# Patient Record
Sex: Female | Born: 1955 | Race: White | Hispanic: No | Smoking: Former smoker
Health system: Southern US, Community
[De-identification: ages and names within clinical notes are randomized; demographics above are authoritative.]

## PROBLEM LIST (undated history)

## (undated) DIAGNOSIS — M1990 Unspecified osteoarthritis, unspecified site: Secondary | ICD-10-CM

## (undated) DIAGNOSIS — B1920 Unspecified viral hepatitis C without hepatic coma: Secondary | ICD-10-CM

## (undated) DIAGNOSIS — M8580 Other specified disorders of bone density and structure, unspecified site: Secondary | ICD-10-CM

## (undated) DIAGNOSIS — J449 Chronic obstructive pulmonary disease, unspecified: Secondary | ICD-10-CM

## (undated) HISTORY — DX: Other specified disorders of bone density and structure, unspecified site: M85.80

## (undated) HISTORY — PX: ABDOMINAL HYSTERECTOMY: SHX81

## (undated) HISTORY — PX: APPENDECTOMY: SHX54

## (undated) HISTORY — PX: HERNIA REPAIR: SHX51

---

## 2008-04-05 ENCOUNTER — Ambulatory Visit: Payer: Self-pay | Admitting: Orthopedic Surgery

## 2009-02-19 ENCOUNTER — Ambulatory Visit: Payer: Self-pay

## 2009-09-10 ENCOUNTER — Ambulatory Visit: Payer: Self-pay | Admitting: Family Medicine

## 2009-09-26 ENCOUNTER — Ambulatory Visit: Payer: Self-pay | Admitting: Podiatry

## 2013-11-28 ENCOUNTER — Ambulatory Visit: Payer: Self-pay | Admitting: Gastroenterology

## 2017-08-25 ENCOUNTER — Other Ambulatory Visit: Payer: Self-pay | Admitting: Student

## 2017-08-25 DIAGNOSIS — B182 Chronic viral hepatitis C: Secondary | ICD-10-CM

## 2017-08-31 ENCOUNTER — Ambulatory Visit
Admission: RE | Admit: 2017-08-31 | Discharge: 2017-08-31 | Disposition: A | Discharge status: Home / Self Care | Payer: BLUE CROSS/BLUE SHIELD | Source: Ambulatory Visit | Attending: Student | Admitting: Student

## 2017-08-31 DIAGNOSIS — B182 Chronic viral hepatitis C: Secondary | ICD-10-CM | POA: Insufficient documentation

## 2017-11-12 IMAGING — US US ABDOMEN COMPLETE W/ ELASTOGRAPHY
1 series · 13 of 25 positions shown · non-contrast
Comparison: None.

CLINICAL DATA: Hepatitis-C, chronic.



[Series 1: us abdomen complete w/ elastography · 0.25mm/px · 13 of 140 slices shown]
[im 1/140]
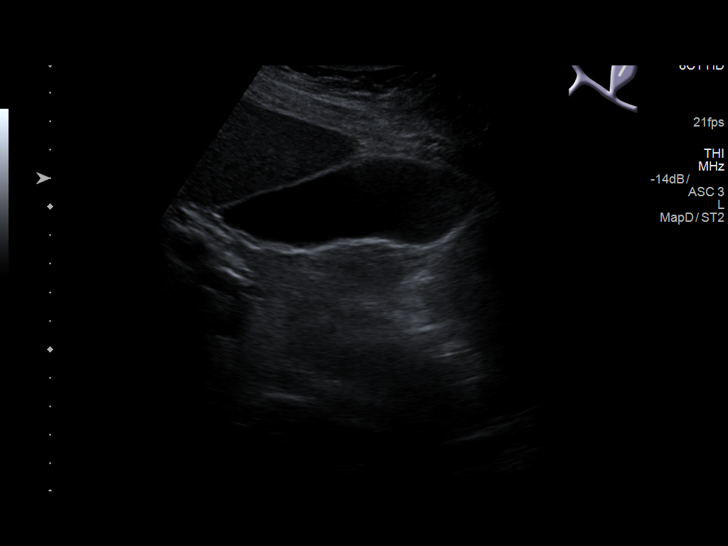
[im 12/140]
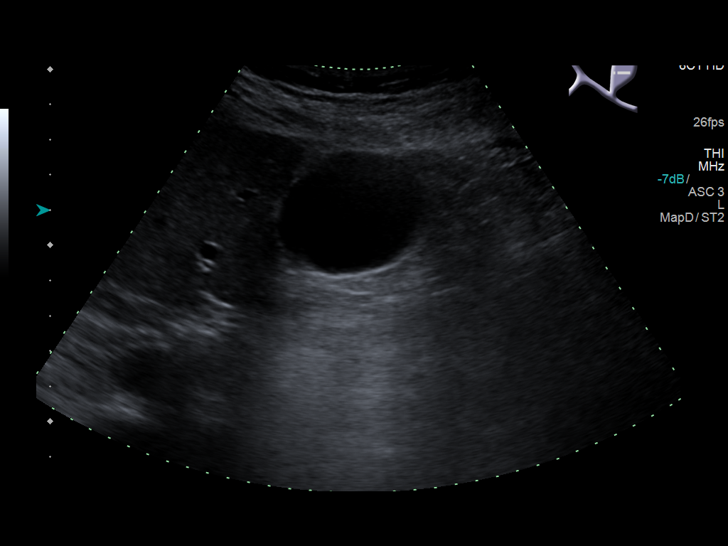
[im 24/140]
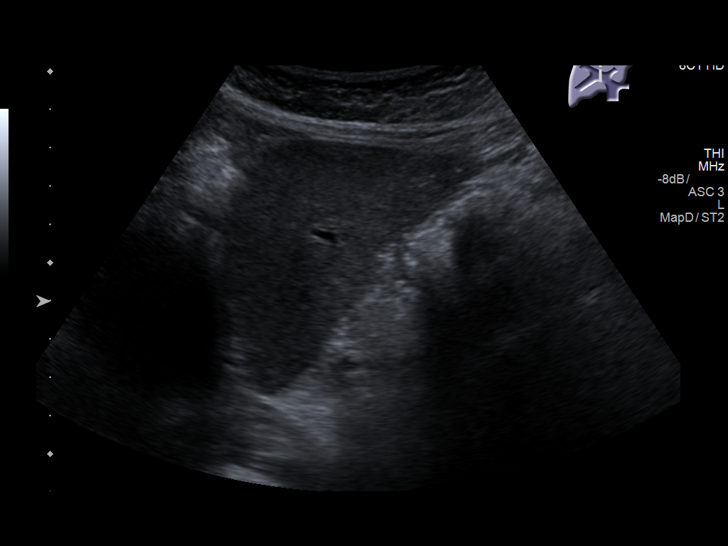
[im 35/140]
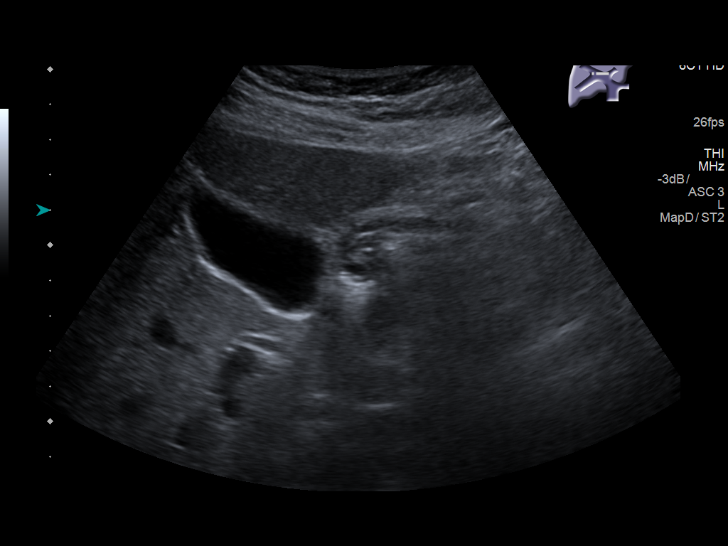
[im 47/140]
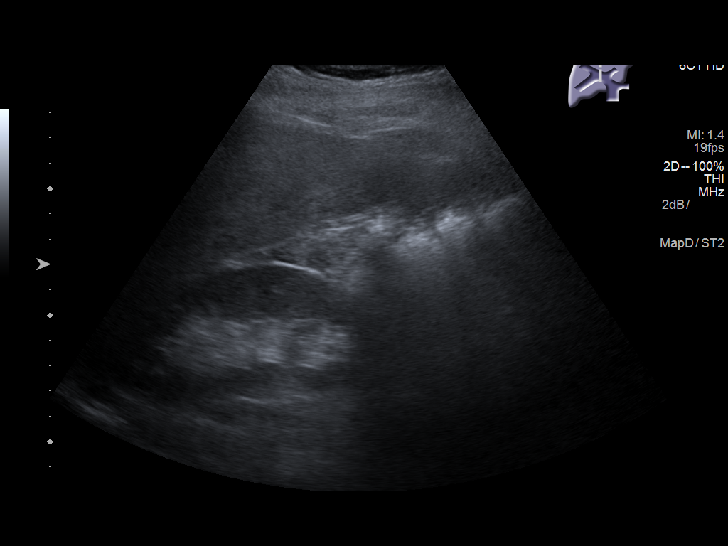
[im 58/140]
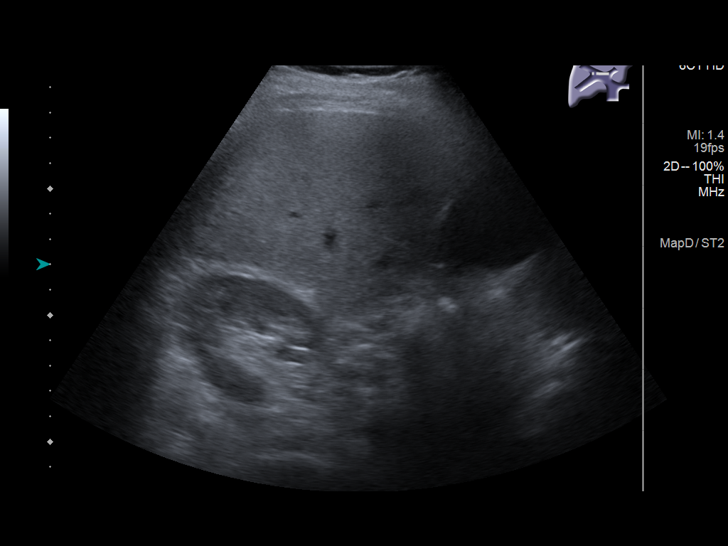
[im 70/140]
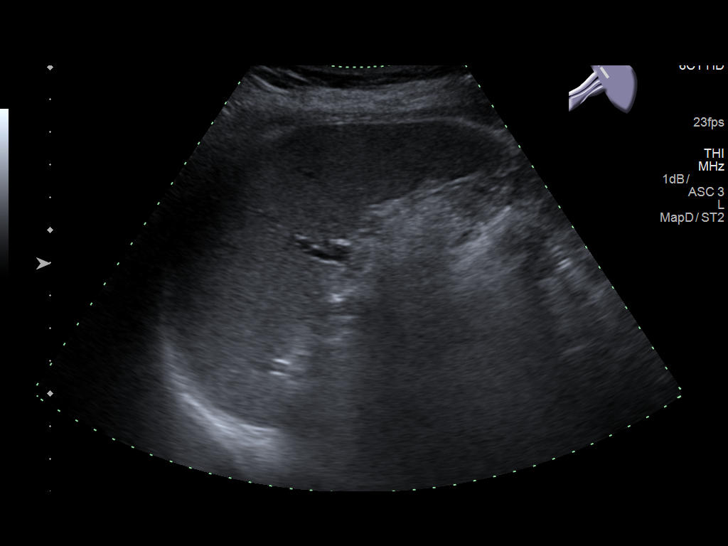
[im 82/140]
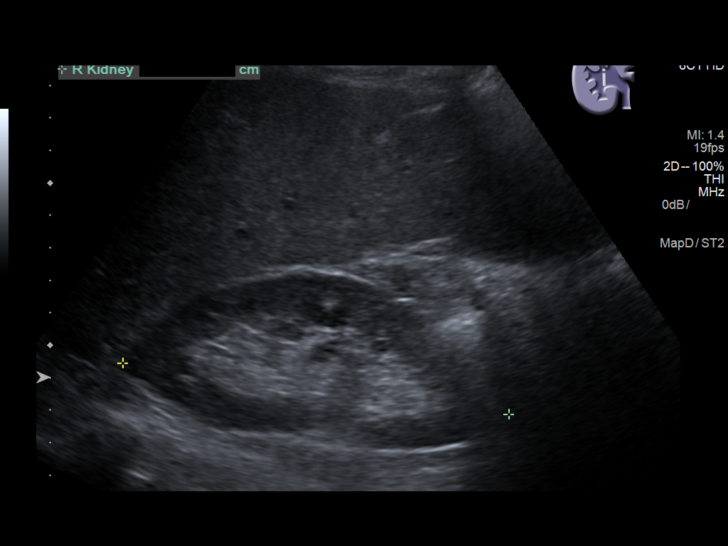
[im 93/140]
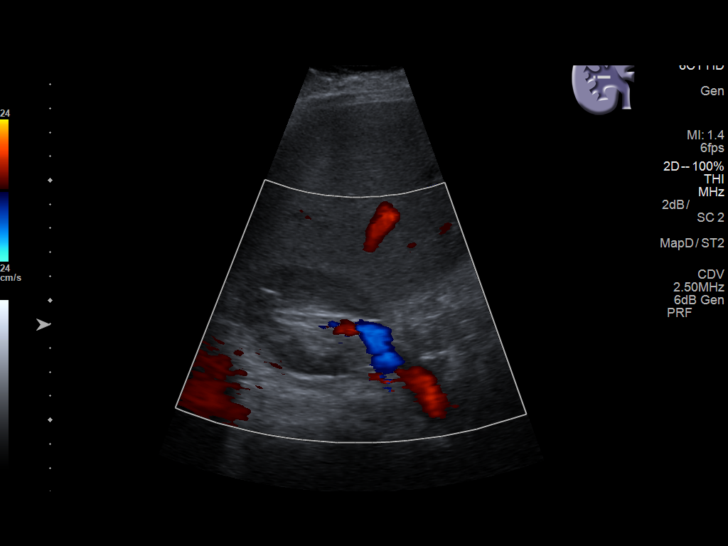
[im 105/140]
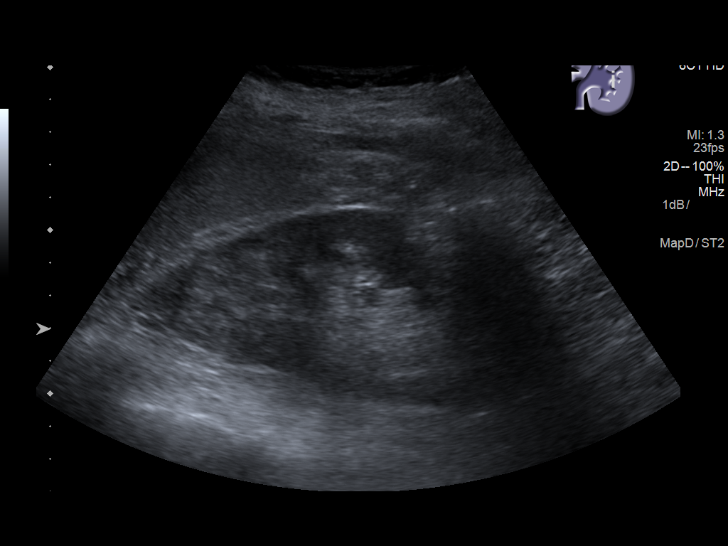
[im 116/140]
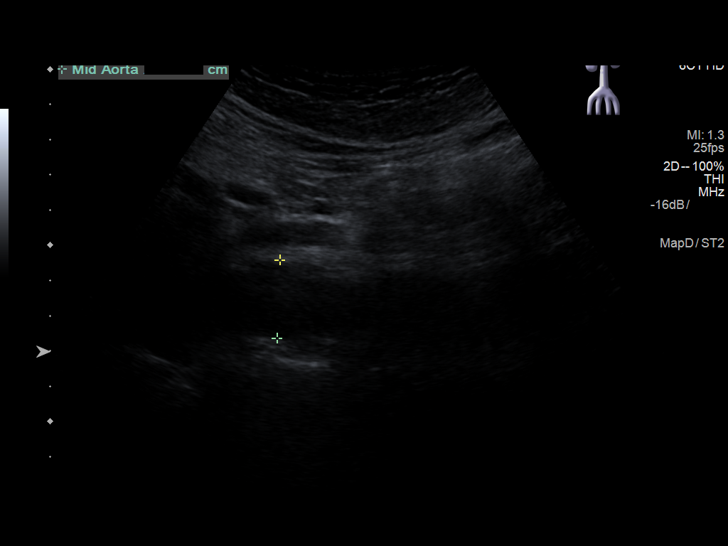
[im 128/140]
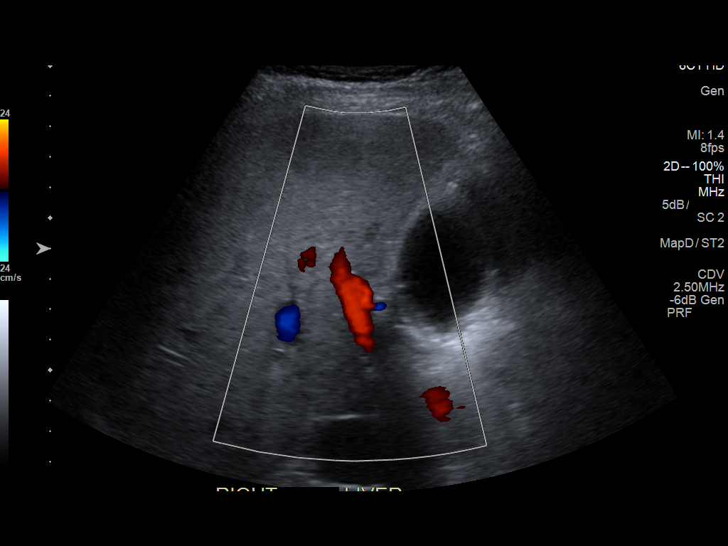
[im 140/140]
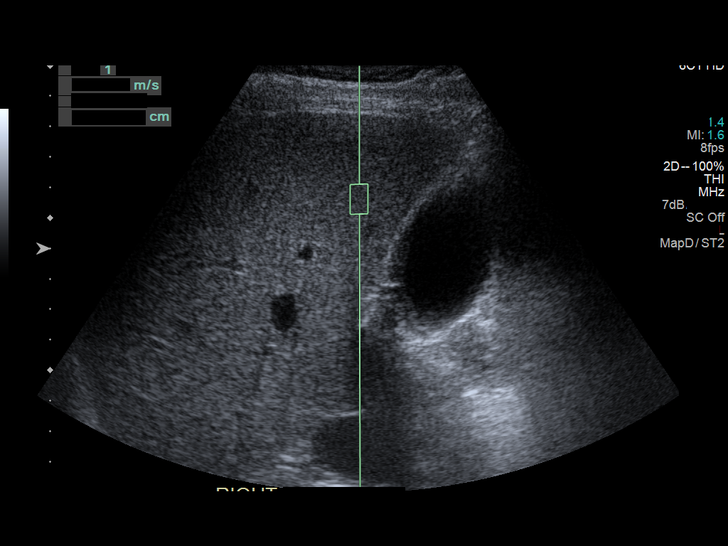

[13 of 25 positions shown; findings below may reference images not displayed]

FINDINGS: ULTRASOUND ABDOMEN

Gallbladder: No gallstones or wall thickening visualized. No
sonographic Murphy sign noted by sonographer.

Common bile duct: Diameter: 4 mm

Liver: Liver parenchyma is diffusely mildly echogenic. No definite
liver surface irregularity fluid. Liver parenchymal echotexture is
within normal limits. No liver mass detected. Portal vein is patent
on color Doppler imaging with normal direction of blood flow towards
the liver.

IVC: No abnormality visualized.

Pancreas: Visualized portion unremarkable.

Spleen: Size and appearance within normal limits.

Right Kidney: Length: 12.0 cm. Echogenicity within normal limits. No
mass or hydronephrosis visualized.

Left Kidney: Length: 12.6 cm. Echogenicity within normal limits. No
mass or hydronephrosis visualized.

Abdominal aorta: No aneurysm visualized.

Other findings: None.

ULTRASOUND HEPATIC ELASTOGRAPHY

Device: Siemens Helix VTQ

Patient position: Supine

Transducer 6C1

Number of measurements: 10

Hepatic segment:  8

Median velocity:   1.33  m/sec

IQR:

IQR/Median velocity ratio:

Corresponding Metavir fibrosis score:  F2 + some F3

Risk of fibrosis: Moderate

Limitations of exam: None

Pertinent findings noted on other imaging exams:  None

Please note that abnormal shear wave velocities may also be
identified in clinical settings other than with hepatic fibrosis,
such as: acute hepatitis, elevated right heart and central venous
pressures including use of beta blockers, Amazigh disease
(Jesus Santos), infiltrative processes such as
mastocytosis/amyloidosis/infiltrative tumor, extrahepatic
cholestasis, in the post-prandial state, and liver transplantation.
Correlation with patient history, laboratory data, and clinical
condition recommended.
IMPRESSION: ULTRASOUND ABDOMEN:

1. Mildly echogenic liver parenchyma, a nonspecific finding that
could be due to steatosis and/or fibrosis. No liver surface
irregularity to suggest cirrhosis. No liver mass.
2. Otherwise normal abdominal sonogram.

ULTRASOUND HEPATIC ELASTOGRAPHY:

Median hepatic shear wave velocity is calculated at 1.33 m/sec.

Corresponding Metavir fibrosis score is  F2 + some F3.

Risk of fibrosis is Moderate.

Follow-up: Additional testing appropriate.

## 2018-07-02 ENCOUNTER — Ambulatory Visit
Admission: RE | Admit: 2018-07-02 | Discharge: 2018-07-02 | Disposition: A | Discharge status: Home / Self Care | Payer: BLUE CROSS/BLUE SHIELD | Source: Ambulatory Visit | Attending: Adult Health | Admitting: Adult Health

## 2018-07-02 ENCOUNTER — Telehealth: Payer: Self-pay | Admitting: Adult Health

## 2018-07-02 ENCOUNTER — Ambulatory Visit: Payer: Self-pay | Admitting: Adult Health

## 2018-07-02 ENCOUNTER — Encounter: Payer: Self-pay | Admitting: Adult Health

## 2018-07-02 DIAGNOSIS — J40 Bronchitis, not specified as acute or chronic: Secondary | ICD-10-CM | POA: Insufficient documentation

## 2018-07-02 DIAGNOSIS — B182 Chronic viral hepatitis C: Secondary | ICD-10-CM

## 2018-07-02 DIAGNOSIS — R059 Cough, unspecified: Secondary | ICD-10-CM

## 2018-07-02 DIAGNOSIS — R350 Frequency of micturition: Secondary | ICD-10-CM

## 2018-07-02 DIAGNOSIS — R05 Cough: Secondary | ICD-10-CM

## 2018-07-02 DIAGNOSIS — R9389 Abnormal findings on diagnostic imaging of other specified body structures: Secondary | ICD-10-CM

## 2018-07-02 DIAGNOSIS — R509 Fever, unspecified: Secondary | ICD-10-CM

## 2018-07-02 DIAGNOSIS — R319 Hematuria, unspecified: Secondary | ICD-10-CM

## 2018-07-02 LAB — POCT URINALYSIS DIPSTICK
APPEARANCE: NORMAL
Bilirubin, UA: NEGATIVE
Glucose, UA: NEGATIVE
Ketones, UA: NEGATIVE
LEUKOCYTES UA: NEGATIVE
Nitrite, UA: NEGATIVE
PH UA: 6 (ref 5.0–8.0)
Protein, UA: NEGATIVE
Spec Grav, UA: 1.025 (ref 1.010–1.025)
UROBILINOGEN UA: 0.2 U/dL

## 2018-07-02 MED ORDER — PREDNISONE 10 MG (21) PO TBPK: ORAL_TABLET | ORAL | 0 refills | Status: DC

## 2018-07-02 MED ORDER — AMOXICILLIN-POT CLAVULANATE 875-125 MG PO TABS: 1.0000 | ORAL_TABLET | Freq: Two times a day (BID) | ORAL | 0 refills | Status: DC

## 2018-07-02 NOTE — Addendum Note (Signed)
Addended by: Berniece PapFLINCHUM, Elmo Shumard S on: 07/02/2018 01:16 PM   Modules accepted: Orders

## 2018-07-02 NOTE — Progress Notes (Addendum)
Patient ID: Crystal ReichmannCheryl B West, female   DOB: 01/24/1956, 62 y.o.   MRN: [id number]  [facility] called 07/02/18 spoke with Crystal BienenstockBrandy who reports STAT CBC and CP normal - except glucose elevated mildly - she was not fasting.  Chest X Ray is pending.

## 2018-07-02 NOTE — Progress Notes (Signed)
Will repeat in one month. Patient is on treatment and has plan.

## 2018-07-02 NOTE — Progress Notes (Signed)
Provider thoroughly discussed in collaboration above plan with supervising physician Dr. Julieanne Mansonichard Gilbert who is in agreement with the care plan as above.

## 2018-07-02 NOTE — Telephone Encounter (Addendum)
07/02/18 Patient was called and informed of chest x ray results showing bronchitic changes as well as fat pad versus infiltrate.   She is advised to start Augmentin and Prednisone and keep recheck for early next week appointment.   CBC and CMET discussed also discussed. Urine culture is still pending She is advised she will need repeat chest  x ray and she can walk in to [facility] after 08/02/18 and should be done around a month after today's date.   Orders Placed This Encounter  Procedures  . DG Chest 2 View    Standing Status:   Future    Standing Expiration Date:   09/02/2018    Order Specific Question:   Reason for Exam (SYMPTOM  OR DIAGNOSIS REQUIRED)    Answer:   follow up from [facility] x [facility] at [facility] performed 07/02/18    Order Specific Question:   Preferred imaging location?    Answer:   [facility]    Order Specific Question:   Call Results- Best Contact Number?    Answer:   [phone]    Order Specific Question:   Radiology Contrast Protocol - do NOT remove file path    Answer:   [email or web address]   Advised patient call the office or your primary care doctor for an appointment if no improvement within 72 hours or if any symptoms change or worsen at any time  Advised ER or urgent Care if after hours or on weekend. Call 911 for emergency symptoms at any time.Patinet verbalized understanding of all instructions given/reviewed and treatment plan and has no further questions or concerns at this time.    Patient verbalized understanding of all instructions given and denies any further questions at this time.

## 2018-07-02 NOTE — Patient Instructions (Signed)
Chest X-Ray A chest X-ray is a painless test that uses radiation to create images of the structures inside of your chest. Chest X-rays are used to look for many health conditions, including heart failure, pneumonia, tuberculosis, rib fractures, breathing disorders, and cancer. They may be used to diagnose chest pain, constant coughing, or trouble breathing. Tell a health care provider about:  Any allergies you have.  All medicines you are taking, including vitamins, herbs, eye drops, creams, and over-the-counter medicines.  Any surgeries you have had.  Any medical conditions you have.  Whether you are pregnant or may be pregnant. What are the risks? Getting a chest X-ray is a safe procedure. However, you will be exposed to a small amount of radiation. Being exposed to too much radiation over a lifetime can increase the risk of cancer. This risk is small, but it may occur if you have many X-rays throughout your life. What happens before the procedure?  You may be asked to remove glasses, jewelry, and any other metal objects.  You will be asked to undress from the waist up. You may be given a hospital gown to wear.  You may be asked to wear a protective lead apron to protect parts of your body from radiation. What happens during the procedure?  You will be asked to stand still as each picture is taken to get the best possible images.  You will be asked to take a deep breath and hold your breath for a few seconds.  The X-ray machine will create a picture of your chest using a tiny burst of radiation. This is painless.  More pictures may be taken from other angles. Typically, one picture will be taken while you face the X-ray camera, and another picture will be taken from the side while you stand. If you cannot stand, you may be asked to lie down. The procedure may vary among health care providers and hospitals. What happens after the procedure?  The X-ray(s) will be reviewed by your  health care provider or an X-ray (radiology) specialist.  It is up to you to get your test results. Ask your health care provider, or the department that is doing the test, when your results will be ready.  Your health care provider will tell you if you need more tests or a follow-up exam. Keep all follow-up visits as told by your health care provider. This is important. Summary  A chest X-ray is a safe, painless test that is used to examine the inside of the chest, heart, and lungs.  You will need to undress from the waist up and remove jewelry and metal objects before the procedure.  You will be exposed to a small amount of radiation during the procedure.  The X-ray machine will take one or more pictures of your chest while you remain as still as possible.  Later, a health care provider or specialist will review the test results with you. This information is not intended to replace advice given to you by your health care provider. Make sure you discuss any questions you have with your health care provider. Document Released: 02/03/2017 Document Revised: 02/03/2017 Document Reviewed: 02/03/2017 Elsevier Interactive Patient Education  2018 [employer]. Hematuria, Adult Hematuria is blood in your urine. It can be caused by a bladder infection, kidney infection, prostate infection, kidney stone, or cancer of your urinary tract. Infections can usually be treated with medicine, and a kidney stone usually will pass through your urine. If neither of these  is the cause of your hematuria, further workup to find out the reason may be needed. It is very important that you tell your health care provider about any blood you see in your urine, even if the blood stops without treatment or happens without causing pain. Blood in your urine that happens and then stops and then happens again can be a symptom of a very serious condition. Also, pain is not a symptom in the initial stages of many urinary  cancers. Follow these instructions at home:  Drink lots of fluid, 3-4 quarts a day. If you have been diagnosed with an infection, cranberry juice is especially recommended, in addition to large amounts of water.  Avoid caffeine, tea, and carbonated beverages because they tend to irritate the bladder.  Avoid alcohol because it may irritate the prostate.  Take all medicines as directed by your health care provider.  If you were prescribed an antibiotic medicine, finish it all even if you start to feel better.  If you have been diagnosed with a kidney stone, follow your health care provider's instructions regarding straining your urine to catch the stone.  Empty your bladder often. Avoid holding urine for long periods of time.  After a bowel movement, women should cleanse front to back. Use each tissue only once.  Empty your bladder before and after sexual intercourse if you are a female. Contact a health care provider if:  You develop back pain.  You have a fever.  You have a feeling of sickness in your stomach (nausea) or vomiting.  Your symptoms are not better in 3 days. Return sooner if you are getting worse. Get help right away if:  You develop severe vomiting and are unable to keep the medicine down.  You develop severe back or abdominal pain despite taking your medicines.  You begin passing a large amount of blood or clots in your urine.  You feel extremely weak or faint, or you pass out. This information is not intended to replace advice given to you by your health care provider. Make sure you discuss any questions you have with your health care provider. Document Released: 12/08/2005 Document Revised: 05/15/2016 Document Reviewed: 08/08/2013 Elsevier Interactive Patient Education  2017 [employer] Fever, Adult A fever is an increase in the body's temperature. It is often defined as a temperature of 100 F (38C) or higher. Short mild or moderate fevers often have no  long-term effects. They also often do not need treatment. Moderate or high fevers may make you feel uncomfortable. Sometimes, they can also be a sign of a serious illness or disease. The sweating that may happen with repeated fevers or fevers that last a while may also cause you to not have enough fluid in your body (dehydration). You can take your temperature with a thermometer to see if you have a fever. A measured temperature can change with:  Age.  Time of day.  Where the thermometer is placed: ? Mouth (oral). ? Rectum (rectal). ? Ear (tympanic). ? Underarm (axillary). ? Forehead (temporal).  Follow these instructions at home: Pay attention to any changes in your symptoms. Take these actions to help with your condition:  Take over-the-counter and prescription medicines only as told by your doctor. Follow the dosing instructions carefully.  If you were prescribed an antibiotic medicine, take it as told by your doctor. Do not stop taking the antibiotic even if you start to feel better.  Rest as needed.  Drink enough fluid to keep your  pee (urine) clear or pale yellow.  Sponge yourself or bathe with room-temperature water as needed. This helps to lower your body temperature . Do not use ice water.  Do not wear too many blankets or heavy clothes.  Contact a doctor if:  You throw up (vomit).  You cannot eat or drink without throwing up.  You have watery poop (diarrhea).  It hurts when you pee.  Your symptoms do not get better with treatment.  You have new symptoms.  You feel very weak. Get help right away if:  You are short of breath or have trouble breathing.  You are dizzy or you pass out (faint).  You feel confused.  You have signs of not having enough fluid in your body, such as: ? A dry mouth. ? Peeing less. ? Looking pale.  You have very bad pain in your belly (abdomen).  You keep throwing up or having water poop.  You have a skin rash.  Your  symptoms suddenly get worse. This information is not intended to replace advice given to you by your health care provider. Make sure you discuss any questions you have with your health care provider. Document Released: 09/16/2008 Document Revised: 05/15/2016 Document Reviewed: 02/01/2015 Elsevier Interactive Patient Education  2018 Else[employer] Inc. Cough, Adult A cough helps to clear your throat and lungs. A cough may last only 2-3 weeks (acute), or it may last longer than 8 weeks (chronic). Many different things can cause a cough. A cough may be a sign of an illness or another medical condition. Follow these instructions at home:  Pay attention to any changes in your cough.  Take medicines only as told by your doctor. ? If you were prescribed an antibiotic medicine, take it as told by your doctor. Do not stop taking it even if you start to feel better. ? Talk with your doctor before you try using a cough medicine.  Drink enough fluid to keep your pee (urine) clear or pale yellow.  If the air is dry, use a cold steam vaporizer or humidifier in your home.  Stay away from things that make you cough at work or at home.  If your cough is worse at night, try using extra pillows to raise your head up higher while you sleep.  Do not smoke, and try not to be around smoke. If you need help quitting, ask your doctor.  Do not have caffeine.  Do not drink alcohol.  Rest as needed. Contact a doctor if:  You have new problems (symptoms).  You cough up yellow fluid (pus).  Your cough does not get better after 2-3 weeks, or your cough gets worse.  Medicine does not help your cough and you are not sleeping well.  You have pain that gets worse or pain that is not helped with medicine.  You have a fever.  You are losing weight and you do not know why.  You have night sweats. Get help right away if:  You cough up blood.  You have trouble breathing.  Your heartbeat is very fast. This  information is not intended to replace advice given to you by your health care provider. Make sure you discuss any questions you have with your health care provider. Document Released: 08/21/2011 Document Revised: 05/15/2016 Document Reviewed: 02/14/2015 Elsevier Interactive Patient Education  [employer].

## 2018-07-02 NOTE — Addendum Note (Signed)
Addended by: Berniece PapFLINCHUM, Freddi Schrager S on: 07/02/2018 04:34 PM   Modules accepted: Orders

## 2018-07-02 NOTE — Progress Notes (Addendum)
Subjective:     Patient ID: Crystal West, female   DOB: 1956-08-07, 62 y.o.   MRN: [id number]   HPI Blood pressure 129/87, pulse 71, temperature 99 F (37.2 C), resp. rate 16, height '5\' 9"'  (1.753 m), weight 161 lb (73 kg), SpO2 99 %.  Patient is a 62 year old female in no acute distress who comes to the clinic and has been running a fever since last Saturday 06/26/18 she reports her fever resolved x 2 days however she was taking fever reducers.  and then returned 07/01/18. She reports fever this am was 101.2 and was around the same all last week. She reports she took two days of work during this time but was able to work through this. She has had intermittent chills x 2 days.   Nasal congestion, coughing. She reports eye itching. Sore throat. She has had productive cough since last Saturday with yellow sputum.   Frequent urination - though she reports she has been drinking fluids increased. She reports lower pelvic  " mild" pressure x 2 days with urination.  Denies any vaginal bleeding. Menopausal x 2 years. She reports she sees her primary care MD at [facility] in [place] regularly and reports normal yearly gynecology exams.   Patient  denies any , body aches, rash, chest pain, shortness of breath, nausea, vomiting, or diarrhea.  Denies any known exposures. Denies any recent hospitalizations or surgeries.   Quit smoking 1 year ago.   Chronic hepatitis C without hepatic coma [facility])  [facility] follows patient and she is due again for recheck in September.   Current Outpatient Medications:  .  alendronate (FOSAMAX) 70 MG tablet, TK 1 T PO ONCE A WK, Disp: , Rfl: 0 .  ALPRAZolam (XANAX) 0.25 MG tablet, Take by mouth., Disp: , Rfl:  .  buprenorphine-naloxone (SUBOXONE) 2-0.5 mg SUBL SL tablet, Place under the tongue., Disp: , Rfl:  . .  clobetasol cream (TEMOVATE) 0.05 %, once daily., Disp: , Rfl:  .  DULoxetine (CYMBALTA) 30 MG capsule, Take 30 mg by mouth., Disp: , Rfl:       She has completed Harvoni treatment  November 2018  she reports.   Review of Systems  Constitutional: Positive for chills (started last night ) and fever. Negative for activity change, appetite change, diaphoresis, fatigue and unexpected weight change.  HENT: Positive for congestion and postnasal drip. Negative for dental problem, drooling, ear discharge, ear pain, facial swelling, hearing loss, mouth sores, nosebleeds, rhinorrhea, sinus pressure, sinus pain, sneezing, sore throat, tinnitus, trouble swallowing and voice change.   Eyes: Positive for itching. Negative for photophobia, pain, discharge, redness and visual disturbance.  Respiratory: Positive for cough. Negative for apnea, choking, chest tightness, shortness of breath, wheezing and stridor.   Cardiovascular: Negative.   Gastrointestinal: Positive for nausea. Negative for abdominal distention, abdominal pain (pelvic tenderness " mild" ), anal bleeding, blood in stool, constipation, diarrhea, rectal pain and vomiting.  Endocrine: Negative.   Genitourinary: Negative for decreased urine volume, difficulty urinating, dyspareunia, dysuria, enuresis, flank pain, frequency, genital sores, hematuria, menstrual problem, pelvic pain, urgency, vaginal bleeding, vaginal discharge and vaginal pain.  Musculoskeletal: Negative.   Skin: Negative.   Allergic/Immunologic: Negative.   Neurological: Negative.   Hematological: Negative for adenopathy. Does not bruise/bleed easily.  Psychiatric/Behavioral: Negative.        Objective:   Physical Exam  Constitutional: She is oriented to person, place, and time. She appears well-developed and well-nourished. She is active.  Non-toxic  appearance. She does not have a sickly appearance. She does not appear ill. No distress. She is not intubated.  Patient is alert and oriented and responsive to questions Engages in eye contact with provider. Speaks in full sentences without any pauses without any shortness  of breath or distress.   Temperature 99 took Motrin last - last pm.   HENT:  Head: Normocephalic and atraumatic.  Right Ear: Tympanic membrane is not perforated and not erythematous. A middle ear effusion is present.  Left Ear: Tympanic membrane is not perforated and not erythematous. A middle ear effusion is present.  Nose: Mucosal edema and rhinorrhea present. Right sinus exhibits frontal sinus tenderness. Right sinus exhibits no maxillary sinus tenderness. Left sinus exhibits frontal sinus tenderness. Left sinus exhibits no maxillary sinus tenderness.  Mouth/Throat: Uvula is midline and mucous membranes are normal. Posterior oropharyngeal erythema present. No oropharyngeal exudate, posterior oropharyngeal edema or tonsillar abscesses.  Eyes: Pupils are equal, round, and reactive to light. Conjunctivae, EOM and lids are normal. Right eye exhibits no discharge. Left eye exhibits no discharge. No scleral icterus.  Neck: Trachea normal, normal range of motion, full passive range of motion without pain and phonation normal. Neck supple. No JVD present. No tracheal deviation present. No Brudzinski's sign noted.  Cardiovascular: Normal rate, regular rhythm, normal heart sounds and intact distal pulses. Exam reveals no gallop and no friction rub.  No murmur heard. Pulmonary/Chest: Effort normal. No accessory muscle usage or stridor. No apnea, no tachypnea and no bradypnea. She is not intubated. No respiratory distress. She has no decreased breath sounds. She has no wheezes. She has rhonchi in the left upper field and the left middle field. She has no rales. She exhibits no tenderness.  Bronchial congestion and cough in room audible by ear.   Abdominal: Soft. Normal appearance and bowel sounds are normal. She exhibits no shifting dullness, no distension, no pulsatile liver, no fluid wave, no abdominal bruit, no ascites, no pulsatile midline mass and no mass. There is no hepatosplenomegaly, splenomegaly or  hepatomegaly. There is no tenderness. There is no rigidity, no rebound, no guarding, no CVA tenderness, no tenderness at McBurney's point and negative Murphy's sign.    Area marked on diagram - with deep palpation patient reports mild   Musculoskeletal: Normal range of motion.  Lymphadenopathy:       Head (right side): No submental, no submandibular, no tonsillar, no preauricular, no posterior auricular and no occipital adenopathy present.       Head (left side): No submental, no submandibular, no tonsillar, no preauricular, no posterior auricular and no occipital adenopathy present.    She has no cervical adenopathy.  Neurological: She is alert and oriented to person, place, and time. She has normal strength. She displays normal reflexes. No cranial nerve deficit. She exhibits normal muscle tone. Coordination and gait normal.  Skin: Skin is warm, dry and intact. Capillary refill takes less than 2 seconds. No rash noted. She is not diaphoretic. No cyanosis or erythema. No pallor. Nails show no clubbing.  Psychiatric: She has a normal mood and affect. Her behavior is normal. Judgment and thought content normal.  Vitals reviewed.      Assessment:     Cough - Plan: DG Chest 2 View  Chronic hepatitis C without hepatic coma (HCC) - Plan: CBC w/Diff, Comp Met (CMET)  Hematuria, unspecified type - Plan: Urine Culture  Fever, unspecified fever cause  Frequent urination      Plan:  Orders Placed This Encounter  Procedures  . Urine Culture  . DG Chest 2 View    Standing Status:   Future    Standing Expiration Date:   09/03/2019    Order Specific Question:   Reason for Exam (SYMPTOM  OR DIAGNOSIS REQUIRED)    Answer:   rhonchi left side chest - middle / cough fever 101.1 x 8 days.    Order Specific Question:   Preferred imaging location?    Answer:   [facility]    Order Specific Question:   Radiology Contrast Protocol - do NOT remove file path    Answer:    [email or web address]  . CBC w/Diff  . Comp Met (CMET)   Meds ordered this encounter  Medications  . amoxicillin-clavulanate (AUGMENTIN) 875-125 MG tablet    Sig: Take 1 tablet by mouth 2 (two) times daily.    Dispense:  20 tablet    Refill:  0  . predniSONE (STERAPRED UNI-PAK 21 TAB) 10 MG (21) TBPK tablet    Sig: PO: Take 6 tablets on day 1:Take 5 tablets day 2:Take 4 tablets day 3: Take 3 tablets day 4:Take 2 tablets day five: 5 Take 1 tablet day 6    Dispense:  21 tablet    Refill:  0    Keep follow up for gastrointestinal. Make follow up as soon as possible with PCP at [facility]. Informed hematuria will need to be rechecked for clearing and possible referral if not cleared for further work up.  Labs ordered STAT as well as CXR- will call with results. Patient is aware to go to the emergency room if any symptoms worsen or fever persists with treatment. Chest x ray to rule out pneumonia or other etiology.  Provider thoroughly discussed in collaboration above plan with supervising physician Dr. Miguel Aschoff who is in agreement with the care plan as above.     Return in about 3 days (around 07/05/2018) for at any time for any worsening symptoms, Call 911 for emergencies.   Advised patient call the office or your primary care doctor for an appointment if no improvement within 72 hours or if any symptoms change or worsen at any time  Advised ER or urgent Care if after hours or on weekend. Call 911 for emergency symptoms at any time.Patinet verbalized understanding of all instructions given/reviewed and treatment plan and has no further questions or concerns at this time.

## 2018-07-04 LAB — CBC WITH DIFFERENTIAL/PLATELET
Basophils Absolute: 0 x10E3/uL (ref 0.0–0.2)
Basos: 0 %
EOS (ABSOLUTE): 0.1 x10E3/uL (ref 0.0–0.4)
Eos: 2 %
Hematocrit: 39.2 % (ref 34.0–46.6)
Hemoglobin: 13.6 g/dL (ref 11.1–15.9)
Immature Grans (Abs): 0 x10E3/uL (ref 0.0–0.1)
Immature Granulocytes: 0 %
Lymphocytes Absolute: 1.5 x10E3/uL (ref 0.7–3.1)
Lymphs: 21 %
MCH: 30.4 pg (ref 26.6–33.0)
MCHC: 34.7 g/dL (ref 31.5–35.7)
MCV: 88 fL (ref 79–97)
Monocytes Absolute: 0.9 x10E3/uL (ref 0.1–0.9)
Monocytes: 12 %
Neutrophils Absolute: 4.6 x10E3/uL (ref 1.4–7.0)
Neutrophils: 65 %
Platelets: 183 x10E3/uL (ref 150–450)
RBC: 4.48 x10E6/uL (ref 3.77–5.28)
RDW: 12.5 % (ref 12.3–15.4)
WBC: 7.1 x10E3/uL (ref 3.4–10.8)

## 2018-07-04 LAB — COMPREHENSIVE METABOLIC PANEL
ALT: 15 IU/L (ref 0–32)
AST: 9 IU/L (ref 0–40)
Albumin/Globulin Ratio: 1.6 (ref 1.2–2.2)
Albumin: 4.5 g/dL (ref 3.6–4.8)
Alkaline Phosphatase: 95 IU/L (ref 39–117)
BUN/Creatinine Ratio: 19 (ref 12–28)
BUN: 11 mg/dL (ref 8–27)
Bilirubin Total: 0.7 mg/dL (ref 0.0–1.2)
CO2: 22 mmol/L (ref 20–29)
CREATININE: 0.59 mg/dL (ref 0.57–1.00)
Calcium: 8.9 mg/dL (ref 8.7–10.3)
Chloride: 102 mmol/L (ref 96–106)
GFR calc Af Amer: 114 mL/min/{1.73_m2} (ref >59)
GFR, EST NON AFRICAN AMERICAN: 99 mL/min/{1.73_m2} (ref >59)
GLOBULIN, TOTAL: 2.8 g/dL (ref 1.5–4.5)
GLUCOSE: 106 mg/dL — AB (ref 65–99)
Potassium: 3.6 mmol/L (ref 3.5–5.2)
Sodium: 140 mmol/L (ref 134–144)
Total Protein: 7.3 g/dL (ref 6.0–8.5)

## 2018-07-04 LAB — URINE CULTURE: Organism ID, Bacteria: NO GROWTH

## 2018-07-04 NOTE — Progress Notes (Signed)
Normal urine culture patient is aware no call would be made if normal urine results.

## 2018-07-05 ENCOUNTER — Encounter: Payer: Self-pay | Admitting: Medical

## 2018-07-05 ENCOUNTER — Ambulatory Visit: Payer: Self-pay | Admitting: Medical

## 2018-07-05 VITALS — BP 140/85 | HR 69 | Temp 98.2°F | Resp 18 | Wt 164.4 lb

## 2018-07-05 DIAGNOSIS — R05 Cough: Secondary | ICD-10-CM

## 2018-07-05 DIAGNOSIS — R0602 Shortness of breath: Secondary | ICD-10-CM

## 2018-07-05 DIAGNOSIS — R059 Cough, unspecified: Secondary | ICD-10-CM

## 2018-07-05 MED ORDER — BENZONATATE 100 MG PO CAPS: ORAL_CAPSULE | ORAL | 0 refills | Status: DC

## 2018-07-05 MED ORDER — ALBUTEROL SULFATE HFA 108 (90 BASE) MCG/ACT IN AERS: 2.0000 | INHALATION_SPRAY | Freq: Four times a day (QID) | RESPIRATORY_TRACT | 0 refills | Status: AC | PRN

## 2018-07-05 NOTE — Patient Instructions (Addendum)
Try OTC Mucinex,  Use inahler and Tessalon Perles as prescribed.Albuterol inhalation aerosol What is this medicine? ALBUTEROL (al Gaspar Bidding) is a bronchodilator. It helps open up the airways in your lungs to make it easier to breathe. This medicine is used to treat and to prevent bronchospasm. This medicine may be used for other purposes; ask your health care provider or pharmacist if you have questions. COMMON BRAND NAME(S): Proair HFA, Proventil, Proventil HFA, Respirol, Ventolin, Ventolin HFA What should I tell my health care provider before I take this medicine? They need to know if you have any of the following conditions: -diabetes -heart disease or irregular heartbeat -high blood pressure -pheochromocytoma -seizures -thyroid disease -an unusual or allergic reaction to albuterol, levalbuterol, sulfites, other medicines, foods, dyes, or preservatives -pregnant or trying to get pregnant -breast-feeding How should I use this medicine? This medicine is for inhalation through the mouth. Follow the directions on your prescription label. Take your medicine at regular intervals. Do not use more often than directed. Make sure that you are using your inhaler correctly. Ask you doctor or health care provider if you have any questions. Talk to your pediatrician regarding the use of this medicine in children. Special care may be needed. Overdosage: If you think you have taken too much of this medicine contact a poison control center or emergency room at once. NOTE: This medicine is only for you. Do not share this medicine with others. What if I miss a dose? If you miss a dose, use it as soon as you can. If it is almost time for your next dose, use only that dose. Do not use double or extra doses. What may interact with this medicine? -anti-infectives like chloroquine and pentamidine -caffeine -cisapride -diuretics -medicines for colds -medicines for depression or for emotional or psychotic  conditions -medicines for weight loss including some herbal products -methadone -some antibiotics like clarithromycin, erythromycin, levofloxacin, and linezolid -some heart medicines -steroid hormones like dexamethasone, cortisone, hydrocortisone -theophylline -thyroid hormones This list may not describe all possible interactions. Give your health care provider a list of all the medicines, herbs, non-prescription drugs, or dietary supplements you use. Also tell them if you smoke, drink alcohol, or use illegal drugs. Some items may interact with your medicine. What should I watch for while using this medicine? Tell your doctor or health care professional if your symptoms do not improve. Do not use extra albuterol. If your asthma or bronchitis gets worse while you are using this medicine, call your doctor right away. If your mouth gets dry try chewing sugarless gum or sucking hard candy. Drink water as directed. What side effects may I notice from receiving this medicine? Side effects that you should report to your doctor or health care professional as soon as possible: -allergic reactions like skin rash, itching or hives, swelling of the face, lips, or tongue -breathing problems -chest pain -feeling faint or lightheaded, falls -high blood pressure -irregular heartbeat -fever -muscle cramps or weakness -pain, tingling, numbness in the hands or feet -vomiting Side effects that usually do not require medical attention (report to your doctor or health care professional if they continue or are bothersome): -cough -difficulty sleeping -headache -nervousness or trembling -stomach upset -stuffy or runny nose -throat irritation -unusual taste This list may not describe all possible side effects. Call your doctor for medical advice about side effects. You may report side effects to FDA at 1-800-FDA-1088. Where should I keep my medicine? Keep out of the reach of  children. Store at room  temperature between 15 and 30 degrees C (59 and 86 degrees F). The contents are under pressure and may burst when exposed to heat or flame. Do not freeze. This medicine does not work as well if it is too cold. Throw away any unused medicine after the expiration date. Inhalers need to be thrown away after the labeled number of puffs have been used or by the expiration date; whichever comes first. Ventolin HFA should be thrown away 12 months after removing from foil pouch. Check the instructions that come with your medicine. NOTE: This sheet is a summary. It may not cover all possible information. If you have questions about this medicine, talk to your doctor, pharmacist, or health care provider.  2018 Elsevier/Gold Standard (2013-05-26 10:57:17)    Guaifenesin oral ER tablets     What is this medicine? GUAIFENESIN (gwye FEN e sin) is an expectorant. It helps to thin mucous and make coughs more productive. This medicine is used to treat coughs caused by colds or the flu. It is not intended to treat chronic cough caused by smoking, asthma, emphysema, or heart failure. This medicine may be used for other purposes; ask your health care provider or pharmacist if you have questions. COMMON BRAND NAME(S): Humibid, Mucinex What should I tell my health care provider before I take this medicine? They need to know if you have any of these conditions: -fever -kidney disease -an unusual or allergic reaction to guaifenesin, other medicines, foods, dyes, or preservatives -pregnant or trying to get pregnant -breast-feeding How should I use this medicine? Take this medicine by mouth with a full glass of water. Follow the directions on the prescription label. Do not break, chew or crush this medicine. You may take with food or on an empty stomach. Take your medicine at regular intervals. Do not take your medicine more often than directed. Talk to your pediatrician regarding the use of this medicine in  children. While this drug may be prescribed for children as young as 67 years old for selected conditions, precautions do apply. Overdosage: If you think you have taken too much of this medicine contact a poison control center or emergency room at once. NOTE: This medicine is only for you. Do not share this medicine with others. What if I miss a dose? If you miss a dose, take it as soon as you can. If it is almost time for your next dose, take only that dose. Do not take double or extra doses. What may interact with this medicine? Interactions are not expected. This list may not describe all possible interactions. Give your health care provider a list of all the medicines, herbs, non-prescription drugs, or dietary supplements you use. Also tell them if you smoke, drink alcohol, or use illegal drugs. Some items may interact with your medicine. What should I watch for while using this medicine? Do not treat a cough for more than 1 week without consulting your doctor or health care professional. If you also have a high fever, skin rash, continuing headache, or sore throat, see your doctor. For best results, drink 6 to 8 glasses water daily while you are taking this medicine. What side effects may I notice from receiving this medicine? Side effects that you should report to your doctor or health care professional as soon as possible: -allergic reactions like skin rash, itching or hives, swelling of the face, lips, or tongue Side effects that usually do not require medical attention (report to  your doctor or health care professional if they continue or are bothersome): -dizziness -headache -stomach upset This list may not describe all possible side effects. Call your doctor for medical advice about side effects. You may report side effects to FDA at 1-800-FDA-1088. Where should I keep my medicine? Keep out of the reach of children. Store at room temperature between 20 and 25 degrees C (68 and 77  degrees F). Keep container tightly closed. Throw away any unused medicine after the expiration date. NOTE: This sheet is a summary. It may not cover all possible information. If you have questions about this medicine, talk to your doctor, pharmacist, or health care provider.  2018 Elsevier/Gold Standard (2008-04-19 12:14:14) Cough, Adult A cough helps to clear your throat and lungs. A cough may last only 2-3 weeks (acute), or it may last longer than 8 weeks (chronic). Many different things can cause a cough. A cough may be a sign of an illness or another medical condition. Follow these instructions at home:  Pay attention to any changes in your cough.  Take medicines only as told by your doctor. ? If you were prescribed an antibiotic medicine, take it as told by your doctor. Do not stop taking it even if you start to feel better. ? Talk with your doctor before you try using a cough medicine.  Drink enough fluid to keep your pee (urine) clear or pale yellow.  If the air is dry, use a cold steam vaporizer or humidifier in your home.  Stay away from things that make you cough at work or at home.  If your cough is worse at night, try using extra pillows to raise your head up higher while you sleep.  Do not smoke, and try not to be around smoke. If you need help quitting, ask your doctor.  Do not have caffeine.  Do not drink alcohol.  Rest as needed. Contact a doctor if:  You have new problems (symptoms).  You cough up yellow fluid (pus).  Your cough does not get better after 2-3 weeks, or your cough gets worse.  Medicine does not help your cough and you are not sleeping well.  You have pain that gets worse or pain that is not helped with medicine.  You have a fever.  You are losing weight and you do not know why.  You have night sweats. Get help right away if:  You cough up blood.  You have trouble breathing.  Your heartbeat is very fast. This information is not  intended to replace advice given to you by your health care provider. Make sure you discuss any questions you have with your health care provider. Document Released: 08/21/2011 Document Revised: 05/15/2016 Document Reviewed: 02/14/2015 Elsevier Interactive Patient Education  2018 [employer] Community-Acquired Pneumonia, Adult Pneumonia is an infection of the lungs. One type of pneumonia can happen while a person is in a hospital. A different type can happen when a person is not in a hospital (community-acquired pneumonia). It is easy for this kind to spread from person to person. It can spread to you if you breathe near an infected person who coughs or sneezes. Some symptoms include:  A dry cough.  A wet (productive) cough.  Fever.  Sweating.  Chest pain.  Follow these instructions at home:  Take over-the-counter and prescription medicines only as told by your doctor. ? Only take cough medicine if you are losing sleep. ? If you were prescribed an antibiotic medicine, take it as  told by your doctor. Do not stop taking the antibiotic even if you start to feel better.  Sleep with your head and neck raised (elevated). You can do this by putting a few pillows under your head, or you can sleep in a recliner.  Do not use tobacco products. These include cigarettes, chewing tobacco, and e-cigarettes. If you need help quitting, ask your doctor.  Drink enough water to keep your pee (urine) clear or pale yellow. A shot (vaccine) can help prevent pneumonia. Shots are often suggested for:  People older than 62 years of age.  People older than 62 years of age: ? Who are having cancer treatment. ? Who have long-term (chronic) lung disease. ? Who have problems with their body's defense system (immune system).  You may also prevent pneumonia if you take these actions:  Get the flu (influenza) shot every year.  Go to the dentist as often as told.  Wash your hands often. If soap and water  are not available, use hand sanitizer.  Contact a doctor if:  You have a fever.  You lose sleep because your cough medicine does not help. Get help right away if:  You are short of breath and it gets worse.  You have more chest pain.  Your sickness gets worse. This is very serious if: ? You are an older adult. ? Your body's defense system is weak.  You cough up blood. This information is not intended to replace advice given to you by your health care provider. Make sure you discuss any questions you have with your health care provider. Document Released: 05/26/2008 Document Revised: 05/15/2016 Document Reviewed: 04/04/2015 Elsevier Interactive Patient Education  [employer].

## 2018-07-05 NOTE — Progress Notes (Signed)
   Subjective:    Patient ID: Crystal West, female    DOB: 09/15/1956, 62 y.o.   MRN: [id number]  HPI 62 yo female in non acute distress, returns today for check up of Pneumonia. Started on Augmentin and Prednisone after x-ray showed. Feeling better. Still coughing and feeling like she can not cough it up. Denies fever since last Friday. No chills. Denies shortness of breath or   IMPRESSION: 07/02/18 1. Bronchitic change in the lungs. 2. Opacity adjacent to the right side of the heart is favored to represent a prominent fat pad with infiltrate considered less likely.  2. Lower abdominal pressure and frequency.urine sent off for culture. No growth per lab test.  No Motrin or Tylenol since Friday. Works  3 rd shift.  Review of Systems  Constitutional: Positive for fever (not since Friday).  HENT: Positive for congestion. Negative for ear pain, sinus pressure, sinus pain and sore throat.   Eyes: Positive for itching (intially better now). Negative for discharge.  Respiratory: Positive for cough and shortness of breath (with working, rests and then improveds). Negative for chest tightness and wheezing.   Cardiovascular: Positive for chest pain (central of chest and indigestion). Negative for palpitations and leg swelling.  Gastrointestinal: Negative for abdominal pain, diarrhea, nausea and vomiting.  Endocrine: Positive for polyphagia ("i think it is the prednisone"). Negative for polydipsia and polyuria.  Genitourinary: Positive for frequency (decreased though) and urgency. Negative for decreased urine volume, dysuria, hematuria, vaginal bleeding and vaginal discharge.  Musculoskeletal: Negative for myalgias (improved).  Skin: Negative for rash.  Allergic/Immunologic: Positive for environmental allergies (oaks trees). Negative for food allergies and immunocompromised state.  Neurological: Negative for dizziness, syncope, light-headedness and headaches.  Hematological: Negative for  adenopathy.  Psychiatric/Behavioral: Negative for behavioral problems, confusion, self-injury and suicidal ideas.   Cough is better. Productive at times. Using Robttussin.    Objective:   Physical Exam  Cardiovascular: Normal rate, regular rhythm and normal heart sounds. Exam reveals no gallop and no friction rub.  No murmur heard. Pulmonary/Chest: Effort normal. She has decreased breath sounds. She has wheezes (expiratory wheeze on the left side.) in the left lower field. She has rhonchi in the left lower field.      Rhonchi mild noted on  Left lower lung field  Psychiatric: She has a normal mood and affect. Her behavior is normal. Judgment and thought content normal.  Nursing note and vitals reviewed.  Rhonchi on the left lower lung field   Cough noted in room    Assessment & Plan:  Pneumonia Finish antibiotoics.  Take antibiotics with food.  Meds ordered this encounter  Medications  . benzonatate (TESSALON PERLES) 100 MG capsule    Sig: Take 1-2 capsules by mouth every 8 hours as needed for cough    Dispense:  30 capsule    Refill:  0  . albuterol (PROVENTIL HFA;VENTOLIN HFA) 108 (90 Base) MCG/ACT inhaler    Sig: Inhale 2 puffs into the lungs every 6 (six) hours as needed for wheezing or shortness of breath.    Dispense:  1 Inhaler    Refill:  0  One week follow up  Needs recheck of urine for hematuria.. Unable to put in future order per Epic. Patient verbalizes understanding and has no questions at discharge.

## 2018-07-12 ENCOUNTER — Ambulatory Visit: Payer: Self-pay | Admitting: Medical

## 2018-07-12 ENCOUNTER — Encounter: Payer: Self-pay | Admitting: Medical

## 2018-07-12 VITALS — BP 117/77 | HR 67 | Temp 98.2°F | Resp 18 | Wt 163.0 lb

## 2018-07-12 DIAGNOSIS — J181 Lobar pneumonia, unspecified organism: Principal | ICD-10-CM

## 2018-07-12 DIAGNOSIS — J189 Pneumonia, unspecified organism: Secondary | ICD-10-CM

## 2018-07-12 NOTE — Progress Notes (Signed)
   Subjective:    Patient ID: Crystal West, female    DOB: 04/10/1956, 62 y.o.   MRN: [id number]  HPI  62 yo female in non acute distress. One week follow up with history of Pneumonia. Patient states she is feeling better. Missed  2 days of Augmetnin, but back on schedule.Not using Tessalon Perles due to no cough.  Taking Claritin to help with allergies.  Blood pressure 117/77, pulse 67, temperature 98.2 F (36.8 C), temperature source Tympanic, resp. rate 18, weight 163 lb (73.9 kg), SpO2 99 %. . Review of Systems  Constitutional: Negative for chills and fever.  Respiratory: Negative for cough and shortness of breath.   Cardiovascular: Negative for chest pain.   Itchy ears some of the time, better lately, started Claritin.    Objective:   Physical Exam  Constitutional: She is oriented to person, place, and time. She appears well-developed and well-nourished.  HENT:  Head: Normocephalic and atraumatic.  Right Ear: Hearing and external ear normal. A middle ear effusion (improved) is present.  Left Ear: Hearing, external ear and ear canal normal. A middle ear effusion (improved.) is present.  Eyes: Pupils are equal, round, and reactive to light. Conjunctivae and EOM are normal.  Neck: Normal range of motion.  Cardiovascular: Normal rate, regular rhythm and normal heart sounds.  Pulmonary/Chest: Effort normal and breath sounds normal. No respiratory distress. She has no wheezes. She has no rales.  Neurological: She is alert and oriented to person, place, and time.  Skin: Skin is warm and dry.  Psychiatric: She has a normal mood and affect. Her behavior is normal. Judgment and thought content normal.  Nursing note and vitals reviewed.  Mild irritation  Canal base of right ear, patient denies q-tip use, fingers in ears etc.     Assessment & Plan:  Pneumonia much improved. Eustachian Tube dysfuction. Bilaterally improving. , irritated right canal. 08/02/2018  follow up chest xray,  order placed.Sooner if any concerns reviewed ear pain with patient and to return to clinic if this occurs.Called patient and left message for follow up x-ray date.  Finish antibiotics.  Return to the clinic as needed. Patient verbalizes understanidng and has no questions at discharge.  Unable to print  AVS due to system upgrade in read only chart.

## 2018-07-12 NOTE — Patient Instructions (Signed)
Trouble with computeer in read only could not print AVS.

## 2018-11-24 NOTE — Telephone Encounter (Signed)
Patient declined  

## 2019-03-16 IMAGING — CR DG CHEST 2V
1 series · 2 of 2 positions shown · non-contrast
Comparison: None.

CLINICAL DATA: Productive cough since [REDACTED].  Bronchitis.

EXAM:
CHEST - 2 VIEW

[Series 1: dg chest 2 view · 0.14mm/px · 2 of 2 slices shown]
[im 1/2]
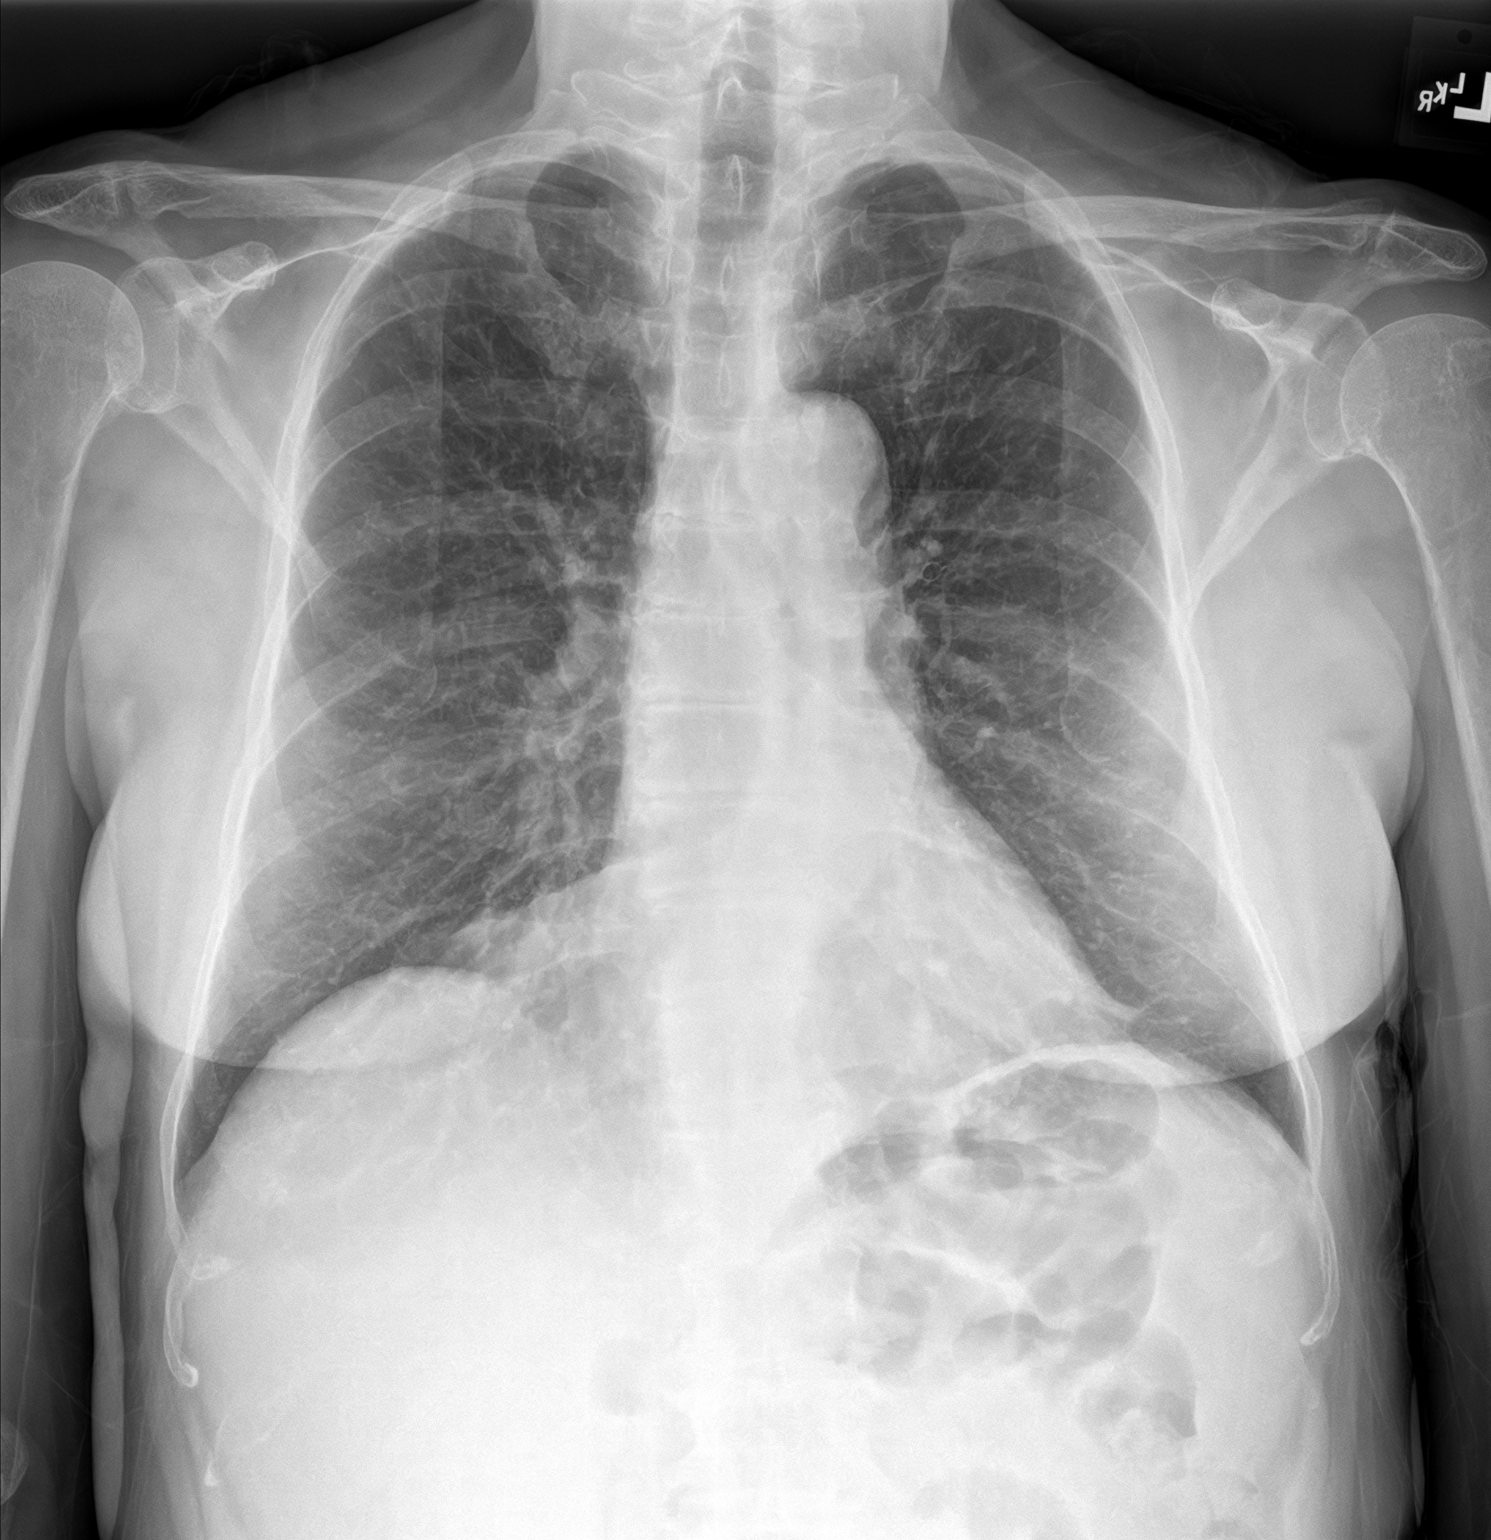
[im 2/2]
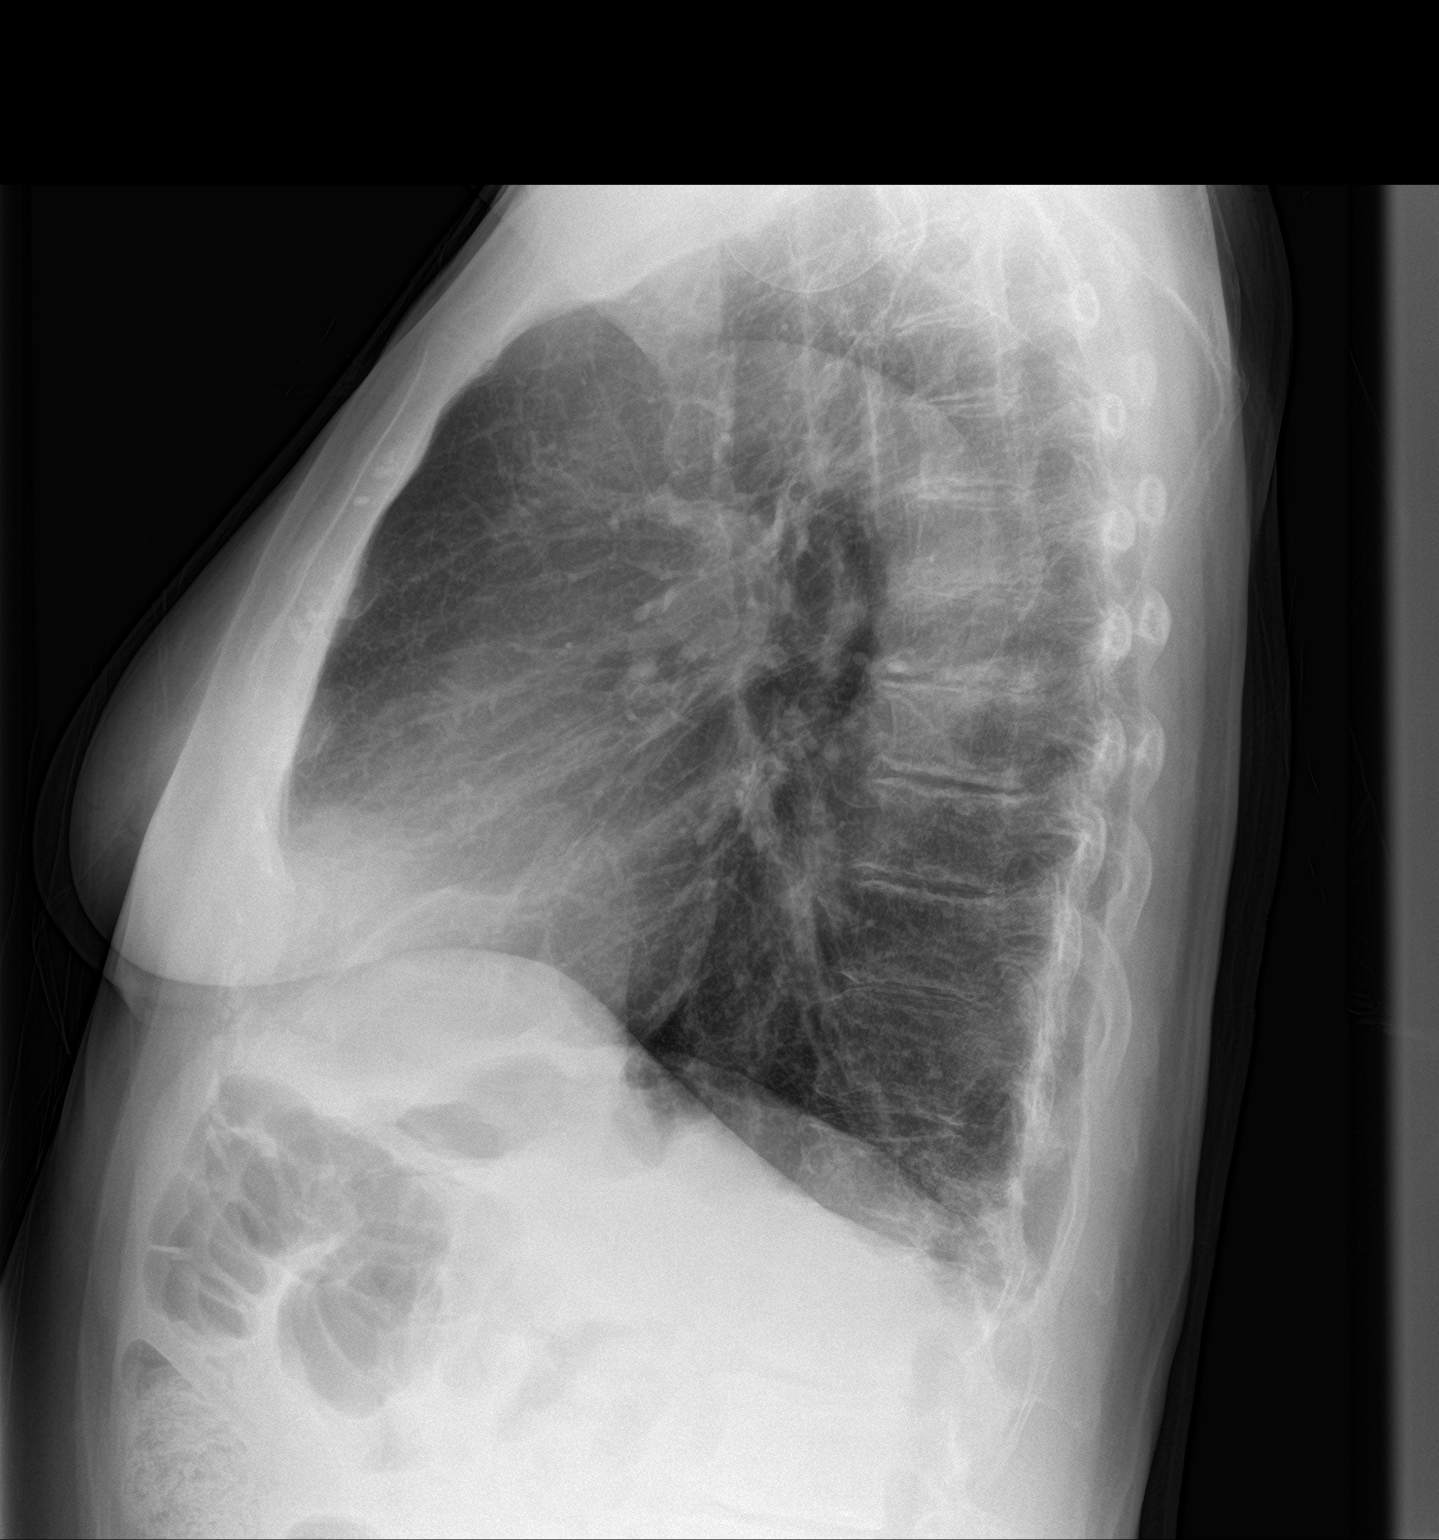

[2 of 2 positions shown; findings below may reference images not displayed]

FINDINGS: The heart, hila, and mediastinum are normal. Mild increased lung
markings identified. Opacity adjacent to the right heart border
could represent a prominent fat pad. Infiltrate considered less
likely. No other acute abnormalities.
IMPRESSION: 1. Bronchitic change in the lungs.
2. Opacity adjacent to the right side of the heart is favored to
represent a prominent fat pad with infiltrate considered less
likely.

## 2019-10-06 ENCOUNTER — Other Ambulatory Visit: Payer: Self-pay

## 2019-10-06 DIAGNOSIS — Z20822 Contact with and (suspected) exposure to covid-19: Secondary | ICD-10-CM

## 2019-10-08 LAB — NOVEL CORONAVIRUS, NAA: SARS-CoV-2, NAA: NOT DETECTED

## 2019-11-15 ENCOUNTER — Other Ambulatory Visit: Payer: Self-pay | Admitting: Podiatry

## 2019-11-15 NOTE — Discharge Instructions (Signed)
[facility] °[facility] ° °POST OPERATIVE INSTRUCTIONS FOR DR. TROXLER, DR. FOWLER, AND DR. BAKER °[facility] ° ° °1. Take your medication as prescribed.  Pain medication should be taken only as needed. ° °2. Keep the dressing clean, dry and intact. ° °3. Keep your foot elevated above the heart level for the first 48 hours. ° °4. Walking to the bathroom and brief periods of walking are acceptable, unless we have instructed you to be non-weight bearing. ° °5. Always wear your post-op shoe when walking.  Always use your crutches if you are to be non-weight bearing. ° °6. Do not take a shower. Baths are permissible as long as the foot is kept out of the water.  ° °7. Every hour you are awake:  °- Bend your knee 15 times. °- Flex foot 15 times °- Massage calf 15 times ° °8. Call [facility] ([phone]) if any of the following problems occur: °- You develop a temperature or fever. °- The bandage becomes saturated with blood. °- Medication does not stop your pain. °- Injury of the foot occurs. °- Any symptoms of infection including redness, odor, or red streaks running from wound. ° °General Anesthesia, Adult, Care After °This sheet gives you information about how to care for yourself after your procedure. Your health care provider may also give you more specific instructions. If you have problems or questions, contact your health care provider. °What can I expect after the procedure? °After the procedure, the following side effects are common: °· Pain or discomfort at the IV site. °· Nausea. °· Vomiting. °· Sore throat. °· Trouble concentrating. °· Feeling cold or chills. °· Weak or tired. °· Sleepiness and fatigue. °· Soreness and body aches. These side effects can affect parts of the body that were not involved in surgery. °Follow these instructions at home: ° °For at least 24 hours after the procedure: °· Have a responsible adult stay with you. It is  important to have someone help care for you until you are awake and alert. °· Rest as needed. °· Do not: °? Participate in activities in which you could fall or become injured. °? Drive. °? Use heavy machinery. °? Drink alcohol. °? Take sleeping pills or medicines that cause drowsiness. °? Make important decisions or sign legal documents. °? Take care of children on your own. °Eating and drinking °· Follow any instructions from your health care provider about eating or drinking restrictions. °· When you feel hungry, start by eating small amounts of foods that are soft and easy to digest (bland), such as toast. Gradually return to your regular diet. °· Drink enough fluid to keep your urine pale yellow. °· If you vomit, rehydrate by drinking water, juice, or clear broth. °General instructions °· If you have sleep apnea, surgery and certain medicines can increase your risk for breathing problems. Follow instructions from your health care provider about wearing your sleep device: °? Anytime you are sleeping, including during daytime naps. °? While taking prescription pain medicines, sleeping medicines, or medicines that make you drowsy. °· Return to your normal activities as told by your health care provider. Ask your health care provider what activities are safe for you. °· Take over-the-counter and prescription medicines only as told by your health care provider. °· If you smoke, do not smoke without supervision. °· Keep all follow-up visits as told by your health care provider. This is important. °Contact a health care provider if: °· You   have nausea or vomiting that does not get better with medicine. °· You cannot eat or drink without vomiting. °· You have pain that does not get better with medicine. °· You are unable to pass urine. °· You develop a skin rash. °· You have a fever. °· You have redness around your IV site that gets worse. °Get help right away if: °· You have difficulty breathing. °· You have chest  pain. °· You have blood in your urine or stool, or you vomit blood. °Summary °· After the procedure, it is common to have a sore throat or nausea. It is also common to feel tired. °· Have a responsible adult stay with you for the first 24 hours after general anesthesia. It is important to have someone help care for you until you are awake and alert. °· When you feel hungry, start by eating small amounts of foods that are soft and easy to digest (bland), such as toast. Gradually return to your regular diet. °· Drink enough fluid to keep your urine pale yellow. °· Return to your normal activities as told by your health care provider. Ask your health care provider what activities are safe for you. °This information is not intended to replace advice given to you by your health care provider. Make sure you discuss any questions you have with your health care provider. °Document Released: 03/16/2001 Document Revised: 12/11/2017 Document Reviewed: 07/24/2017 °Elsevier Patient Education © 2020 [employer] ° °

## 2019-11-21 ENCOUNTER — Encounter: Payer: Self-pay | Admitting: *Deleted

## 2019-11-21 ENCOUNTER — Other Ambulatory Visit
Admission: RE | Admit: 2019-11-21 | Discharge: 2019-11-21 | Disposition: A | Discharge status: Home / Self Care | Payer: BC Managed Care – PPO | Source: Ambulatory Visit | Attending: Podiatry | Admitting: Podiatry

## 2019-11-21 ENCOUNTER — Other Ambulatory Visit: Payer: Self-pay

## 2019-11-21 DIAGNOSIS — Z01812 Encounter for preprocedural laboratory examination: Secondary | ICD-10-CM | POA: Insufficient documentation

## 2019-11-21 DIAGNOSIS — Z20828 Contact with and (suspected) exposure to other viral communicable diseases: Secondary | ICD-10-CM | POA: Insufficient documentation

## 2019-11-22 LAB — SARS CORONAVIRUS 2 (TAT 6-24 HRS): SARS Coronavirus 2: NEGATIVE

## 2019-11-24 ENCOUNTER — Encounter
Admission: RE | Disposition: A | Discharge status: Home / Self Care | Payer: Self-pay | Source: Home / Self Care | Attending: Podiatry

## 2019-11-24 ENCOUNTER — Ambulatory Visit: Payer: BC Managed Care – PPO | Admitting: Anesthesiology

## 2019-11-24 ENCOUNTER — Other Ambulatory Visit: Payer: Self-pay

## 2019-11-24 ENCOUNTER — Encounter: Payer: Self-pay | Admitting: Podiatry

## 2019-11-24 ENCOUNTER — Ambulatory Visit
Admission: RE | Admit: 2019-11-24 | Discharge: 2019-11-24 | Disposition: A | Discharge status: Home / Self Care | Payer: BC Managed Care – PPO | Attending: Podiatry | Admitting: Podiatry

## 2019-11-24 DIAGNOSIS — G5762 Lesion of plantar nerve, left lower limb: Secondary | ICD-10-CM | POA: Diagnosis not present

## 2019-11-24 DIAGNOSIS — Z79899 Other long term (current) drug therapy: Secondary | ICD-10-CM | POA: Diagnosis not present

## 2019-11-24 DIAGNOSIS — J449 Chronic obstructive pulmonary disease, unspecified: Secondary | ICD-10-CM | POA: Insufficient documentation

## 2019-11-24 DIAGNOSIS — M1990 Unspecified osteoarthritis, unspecified site: Secondary | ICD-10-CM | POA: Diagnosis not present

## 2019-11-24 DIAGNOSIS — Z87891 Personal history of nicotine dependence: Secondary | ICD-10-CM | POA: Diagnosis not present

## 2019-11-24 DIAGNOSIS — G8929 Other chronic pain: Secondary | ICD-10-CM | POA: Diagnosis not present

## 2019-11-24 DIAGNOSIS — B182 Chronic viral hepatitis C: Secondary | ICD-10-CM | POA: Diagnosis not present

## 2019-11-24 HISTORY — DX: Chronic obstructive pulmonary disease, unspecified: J44.9

## 2019-11-24 HISTORY — DX: Unspecified osteoarthritis, unspecified site: M19.90

## 2019-11-24 HISTORY — PX: EXCISION MORTON'S NEUROMA: SHX5013

## 2019-11-24 HISTORY — DX: Unspecified viral hepatitis C without hepatic coma: B19.20

## 2019-11-24 SURGERY — EXCISION, MORTON'S NEUROMA
Anesthesia: General | Site: Foot | Laterality: Left

## 2019-11-24 MED ORDER — BUPIVACAINE HCL (PF) 0.25 % IJ SOLN
INTRAMUSCULAR | Status: DC | PRN
  Administered 2019-11-24: 6 mL

## 2019-11-24 MED ORDER — MIDAZOLAM HCL 5 MG/5ML IJ SOLN
INTRAMUSCULAR | Status: DC | PRN
  Administered 2019-11-24: 2 mg via INTRAVENOUS

## 2019-11-24 MED ORDER — LACTATED RINGERS IV SOLN
INTRAVENOUS | Status: DC
  Administered 2019-11-24: 07:00:00 via INTRAVENOUS

## 2019-11-24 MED ORDER — DEXAMETHASONE SODIUM PHOSPHATE 10 MG/ML IJ SOLN
INTRAMUSCULAR | Status: DC | PRN
  Administered 2019-11-24: 8 mg via INTRAVENOUS

## 2019-11-24 MED ORDER — LIDOCAINE HCL (CARDIAC) PF 100 MG/5ML IV SOSY
PREFILLED_SYRINGE | INTRAVENOUS | Status: DC | PRN
  Administered 2019-11-24: 30 mg via INTRATRACHEAL

## 2019-11-24 MED ORDER — POVIDONE-IODINE 7.5 % EX SOLN
Freq: Once | CUTANEOUS | Status: AC
  Administered 2019-11-24: 07:00:00 via TOPICAL

## 2019-11-24 MED ORDER — PROPOFOL 10 MG/ML IV BOLUS
INTRAVENOUS | Status: DC | PRN
  Administered 2019-11-24: 200 mg via INTRAVENOUS

## 2019-11-24 MED ORDER — BUPIVACAINE-EPINEPHRINE (PF) 0.5% -1:200000 IJ SOLN
INTRAMUSCULAR | Status: DC | PRN
  Administered 2019-11-24: 3 mL

## 2019-11-24 MED ORDER — ONDANSETRON HCL 4 MG/2ML IJ SOLN
INTRAMUSCULAR | Status: DC | PRN
  Administered 2019-11-24: 4 mg via INTRAVENOUS

## 2019-11-24 MED ORDER — ACETAMINOPHEN 160 MG/5ML PO SOLN: 325.0000 mg | ORAL | Status: DC | PRN

## 2019-11-24 MED ORDER — OXYCODONE HCL 5 MG/5ML PO SOLN: 5.0000 mg | Freq: Once | ORAL | Status: DC | PRN

## 2019-11-24 MED ORDER — CEFAZOLIN SODIUM-DEXTROSE 2-4 GM/100ML-% IV SOLN
2.0000 g | INTRAVENOUS | Status: AC
  Administered 2019-11-24: 2 g via INTRAVENOUS

## 2019-11-24 MED ORDER — FENTANYL CITRATE (PF) 100 MCG/2ML IJ SOLN: 25.0000 ug | INTRAMUSCULAR | Status: DC | PRN

## 2019-11-24 MED ORDER — KETAMINE HCL 10 MG/ML IJ SOLN
INTRAMUSCULAR | Status: DC | PRN
  Administered 2019-11-24: 25 mg via INTRAVENOUS

## 2019-11-24 MED ORDER — OXYCODONE HCL 5 MG PO TABS: 5.0000 mg | ORAL_TABLET | Freq: Once | ORAL | Status: DC | PRN

## 2019-11-24 MED ORDER — OXYCODONE-ACETAMINOPHEN 7.5-325 MG PO TABS: 1.0000 | ORAL_TABLET | Freq: Four times a day (QID) | ORAL | 0 refills | Status: AC | PRN

## 2019-11-24 MED ORDER — ACETAMINOPHEN 325 MG PO TABS: 325.0000 mg | ORAL_TABLET | ORAL | Status: DC | PRN

## 2019-11-24 MED ORDER — FENTANYL CITRATE (PF) 100 MCG/2ML IJ SOLN
INTRAMUSCULAR | Status: DC | PRN
  Administered 2019-11-24 (×2): 50 ug via INTRAVENOUS

## 2019-11-24 MED ORDER — BUPIVACAINE LIPOSOME 1.3 % IJ SUSP
INTRAMUSCULAR | Status: DC | PRN
  Administered 2019-11-24: 9 mL

## 2019-11-24 MED ORDER — PROPOFOL 10 MG/ML IV BOLUS: INTRAVENOUS | Status: DC | PRN

## 2019-11-24 MED ORDER — GLYCOPYRROLATE 0.2 MG/ML IJ SOLN
INTRAMUSCULAR | Status: DC | PRN
  Administered 2019-11-24: 0.2 mg via INTRAVENOUS

## 2019-11-24 SURGICAL SUPPLY — 30 items
BENZOIN TINCTURE PRP APPL 2/3 (GAUZE/BANDAGES/DRESSINGS) ×3 IMPLANT
BLADE SURG MINI STRL (BLADE) ×3 IMPLANT
BNDG ELASTIC 4X5.8 VLCR STR LF (GAUZE/BANDAGES/DRESSINGS) ×3 IMPLANT
BNDG ESMARK 4X12 TAN STRL LF (GAUZE/BANDAGES/DRESSINGS) ×3 IMPLANT
BNDG GAUZE 4.5X4.1 6PLY STRL (MISCELLANEOUS) ×3 IMPLANT
BNDG STRETCH 4X75 STRL LF (GAUZE/BANDAGES/DRESSINGS) ×3 IMPLANT
CLOSURE WOUND 1/4X4 (GAUZE/BANDAGES/DRESSINGS) ×1
CUFF TOURN SGL QUICK 18X4 (TOURNIQUET CUFF) ×3 IMPLANT
CUFF TOURN SGL QUICK 24 (TOURNIQUET CUFF)
CUFF TRNQT CYL 24X4X40X1 (TOURNIQUET CUFF) IMPLANT
DURAPREP 26ML APPLICATOR (WOUND CARE) ×3 IMPLANT
ELECT REM PT RETURN 9FT ADLT (ELECTROSURGICAL) ×3
ELECTRODE REM PT RTRN 9FT ADLT (ELECTROSURGICAL) ×1 IMPLANT
GAUZE SPONGE 4X4 12PLY STRL (GAUZE/BANDAGES/DRESSINGS) ×3 IMPLANT
GAUZE XEROFORM 1X8 LF (GAUZE/BANDAGES/DRESSINGS) ×3 IMPLANT
GLOVE BIO SURGEON STRL SZ8 (GLOVE) ×3 IMPLANT
GLOVE INDICATOR 7.5 STRL GRN (GLOVE) IMPLANT
GOWN STRL REUS W/ TWL LRG LVL3 (GOWN DISPOSABLE) ×1 IMPLANT
GOWN STRL REUS W/ TWL XL LVL3 (GOWN DISPOSABLE) IMPLANT
GOWN STRL REUS W/TWL LRG LVL3 (GOWN DISPOSABLE) ×2
GOWN STRL REUS W/TWL XL LVL3 (GOWN DISPOSABLE)
KIT TURNOVER KIT A (KITS) ×3 IMPLANT
NEEDLE HYPO 25GX1X1/2 BEV (NEEDLE) ×3 IMPLANT
NS IRRIG 500ML POUR BTL (IV SOLUTION) ×3 IMPLANT
PACK EXTREMITY ARMC (MISCELLANEOUS) ×3 IMPLANT
STOCKINETTE STRL 6IN 960660 (GAUZE/BANDAGES/DRESSINGS) ×3 IMPLANT
STRIP CLOSURE SKIN 1/4X4 (GAUZE/BANDAGES/DRESSINGS) ×2 IMPLANT
SUT ETHILON 5-0 FS-2 18 BLK (SUTURE) IMPLANT
SUT VIC AB 4-0 FS2 27 (SUTURE) ×3 IMPLANT
SYR 10ML LL (SYRINGE) ×3 IMPLANT

## 2019-11-24 NOTE — Transfer of Care (Signed)
Immediate Anesthesia Transfer of Care Note  Patient: Crystal West  Procedure(s) Performed: EXCISION MORTON'S NEUROMA (Left Foot)  Patient Location: PACU  Anesthesia Type: General  Level of Consciousness: awake, alert  and patient cooperative  Airway and Oxygen Therapy: Patient Spontanous Breathing and Patient connected to supplemental oxygen  Post-op Assessment: Post-op Vital signs reviewed, Patient's Cardiovascular Status Stable, Respiratory Function Stable, Patent Airway and No signs of Nausea or vomiting  Post-op Vital Signs: Reviewed and stable  Complications: No apparent anesthesia complications

## 2019-11-24 NOTE — H&P (Signed)
H and P has been reviewed and no changes are noted.  

## 2019-11-24 NOTE — Addendum Note (Signed)
Addendum  created 11/24/19 1111 by Izetta Dakin, CRNA   Child order released for a procedure order, Clinical Note Signed, Intraprocedure Blocks edited, LDA created via procedure documentation

## 2019-11-24 NOTE — Anesthesia Postprocedure Evaluation (Signed)
Anesthesia Post Note  Patient: Crystal West  Procedure(s) Performed: EXCISION MORTON'S NEUROMA (Left Foot)     Patient location during evaluation: Phase II Anesthesia Type: General Level of consciousness: awake and alert, oriented and patient cooperative Pain management: pain level controlled Vital Signs Assessment: post-procedure vital signs reviewed and stable Respiratory status: spontaneous breathing Cardiovascular status: blood pressure returned to baseline and stable Postop Assessment: no headache, no backache, able to ambulate and no apparent nausea or vomiting Anesthetic complications: no    Sinda Du

## 2019-11-24 NOTE — Anesthesia Preprocedure Evaluation (Signed)
Anesthesia Evaluation  Patient identified by MRN, date of birth, ID band Patient awake    Reviewed: Allergy & Precautions, H&P , NPO status , Patient's Chart, lab work & pertinent test results  History of Anesthesia Complications Negative for: history of anesthetic complications  Airway Mallampati: II  TM Distance: >3 FB Neck ROM: Full    Dental no notable dental hx.    Pulmonary COPD,  COPD inhaler, former smoker,    Pulmonary exam normal breath sounds clear to auscultation       Cardiovascular Exercise Tolerance: Good negative cardio ROS Normal cardiovascular exam Rhythm:Regular Rate:Normal     Neuro/Psych negative neurological ROS  negative psych ROS   GI/Hepatic negative GI ROS, (+) Hepatitis -  Endo/Other  negative endocrine ROS  Renal/GU negative Renal ROS  negative genitourinary   Musculoskeletal  (+) Arthritis ,   Abdominal Normal abdominal exam  (+) - obese,   Peds negative pediatric ROS (+)  Hematology negative hematology ROS (+)   Anesthesia Other Findings   Reproductive/Obstetrics negative OB ROS                             Anesthesia Physical Anesthesia Plan  ASA: II  Anesthesia Plan: General   Post-op Pain Management:    Induction: Intravenous  PONV Risk Score and Plan: 2 and TIVA and Treatment may vary due to age or medical condition  Airway Management Planned: Nasal Cannula and Natural Airway  Additional Equipment:   Intra-op Plan:   Post-operative Plan:   Informed Consent: I have reviewed the patients History and Physical, chart, labs and discussed the procedure including the risks, benefits and alternatives for the proposed anesthesia with the patient or authorized representative who has indicated his/her understanding and acceptance.     Dental advisory given  Plan Discussed with: CRNA and Anesthesiologist  Anesthesia Plan Comments:          Anesthesia Quick Evaluation  Patient Active Problem List   Diagnosis Date Noted  . Hepatitis C, chronic (HCC) 07/02/2018    CBC Latest Ref Rng & Units 07/02/2018  WBC 3.4 - 10.8 x10E3/uL 7.1  Hemoglobin 11.1 - 15.9 g/dL 13.6  Hematocrit 34.0 - 46.6 % 39.2  Platelets 150 - 450 x10E3/uL 183   BMP Latest Ref Rng & Units 07/02/2018  Glucose 65 - 99 mg/dL 106(H)  BUN 8 - 27 mg/dL 11  Creatinine 0.57 - 1.00 mg/dL 0.59  BUN/Creat Ratio 12 - 28 19  Sodium 134 - 144 mmol/L 140  Potassium 3.5 - 5.2 mmol/L 3.6  Chloride 96 - 106 mmol/L 102  CO2 20 - 29 mmol/L 22  Calcium 8.7 - 10.3 mg/dL 8.9   Risks and benefits of anesthesia discussed at length, patient or surrogate demonstrates understanding. Appropriately NPO. Plan to proceed with anesthesia.  Champ Mungo, MD 11/24/19

## 2019-11-24 NOTE — Anesthesia Procedure Notes (Signed)
Procedure Name: LMA Insertion Performed by: Izetta Dakin, CRNA Pre-anesthesia Checklist: Patient identified, Emergency Drugs available, Suction available, Patient being monitored and Timeout performed Patient Re-evaluated:Patient Re-evaluated prior to induction Oxygen Delivery Method: Circle system utilized Preoxygenation: Pre-oxygenation with 100% oxygen Induction Type: IV induction Ventilation: Mask ventilation without difficulty LMA: LMA inserted LMA Size: 3.0 Number of attempts: 1 Tube secured with: Tape Dental Injury: Teeth and Oropharynx as per pre-operative assessment

## 2019-11-24 NOTE — Op Note (Signed)
Operative note   Surgeon: Dr. Albertine Patricia, DPM.    Assistant: None    Preop diagnosis: Morton's neuroma left foot    Postop diagnosis: Same    Procedure:   1.  Excision Morton's neuroma left foot           EBL: Less than 10 cc    Anesthesia:general delivered by the anesthesia team.  I injected preoperatively 6 cc of Marcaine plain throughout the general operative site and 3 cc of Marcaine with epinephrine in the third intermetatarsal space only.  Postoperatively I injected 9 cc of Exparel for long-term pain control    Hemostasis: Ankle tourniquet at 225 mmHg for 15 minutes.  This was released prior to any closure to check for any active bleeders which was negative.    Specimen: Nerve tissue third metatarsal space left foot    Complications: None    Operative indications: Chronic pain unresponsive to conservative care    Procedure:  Patient was brought into the OR and placed on the operating table in thesupine position. After anesthesia was obtained theleft lower extremity was prepped and draped in usual sterile fashion.  Operative Report: This time patient had that the dorsum of the left foot to the third metatarsal space where a 3.5 cm linear incision was made and deepened sharp dissection bleeders were clamped and bovied as required vital structures retracted medially and laterally.  Blunt dissection freed up the area down to the intermetatarsal ligaments and plantar pressure caused a large neuroma to the exposed into the wound site.  At this point a laminar spreader was introduced to spread the metatarsals and the intertarsal ligament was released.  The enlarged nerve was traced to its distal branches and released and then traced to its more proximal branches.  When normal nerve tissue was encountered the nerve was resected at this point and sent to pathology.  This point tourniquet was released and the area was copiously irrigated.  Only some mild capillary bleeding was noted.   No active arterial bleeding was noted.  There is an copiously irrigated with normal saline.  Deep and superficial fascial layers were closed with 4-0 Vicryl in a continuous stitch skin was closed with 4-0 Vicryl in a subcuticular fashion.  This point the area was blocked with the Exparel rel and a sterile compressive dressing was placed across the wound consisting of Steri-Strips Xeroform gauze 4 x 4's conformer and Kling.     Patient tolerated the procedure and anesthesia well.  Was transported from the OR to the PACU with all vital signs stable and vascular status intact. To be discharged per routine protocol.  Will follow up in approximately 1 week in the outpatient clinic.

## 2019-11-25 ENCOUNTER — Encounter: Payer: Self-pay | Admitting: Podiatry

## 2019-11-28 LAB — SURGICAL PATHOLOGY

## 2022-09-01 ENCOUNTER — Encounter: Payer: Self-pay | Admitting: Physician Assistant

## 2022-09-01 ENCOUNTER — Ambulatory Visit (INDEPENDENT_AMBULATORY_CARE_PROVIDER_SITE_OTHER): Payer: Self-pay | Admitting: Physician Assistant

## 2022-09-01 VITALS — BP 130/82 | HR 109 | Temp 98.7°F | Ht 69.0 in | Wt 184.6 lb

## 2022-09-01 DIAGNOSIS — U071 COVID-19: Secondary | ICD-10-CM

## 2022-09-01 DIAGNOSIS — R0981 Nasal congestion: Secondary | ICD-10-CM

## 2022-09-01 LAB — POC COVID19 BINAXNOW: SARS Coronavirus 2 Ag: POSITIVE — AB

## 2022-09-01 NOTE — Progress Notes (Signed)
[facility] [address] [place], [place] [address]   Office Visit Note  Patient Name: Crystal West Date of Birth [id number]  Medical Record number [id number]  Date of Service: 09/01/2022  Chief Complaint  Patient presents with   Sore Throat   Cough    Congested, lower back pain, ST an cough. Started yesterday. Took an expired COVID test and it was Pos.      66 y/o F presents to the clinic for c/o sore throat, occasional cough, and nasal congestion x 1 day. +exposure to Covid when cleaning at the [facility] last week. She tested positive yesterday at home, but not conclusive. Started with minimal sore throat and nasal congestion. She had a max temp of 101 degrees F last night and took Ibuprofen. Took decongestant earlier today. She denies CP, SOB or wheezing. Hasn't been covid vaccinated in the past, but has been Covid positive   Sore Throat  Associated symptoms include congestion and coughing. Pertinent negatives include no shortness of breath or trouble swallowing.  Cough Associated symptoms include chills, a fever and postnasal drip. Pertinent negatives include no shortness of breath or wheezing.      Current Medication:  Outpatient Encounter Medications as of 09/01/2022  Medication Sig   buprenorphine-naloxone (SUBOXONE) 2-0.5 mg SUBL SL tablet Place 0.5 tablets under the tongue 2 (two) times daily.    TRELEGY ELLIPTA 100-62.5-25 MCG/ACT AEPB Inhale 1 puff into the lungs daily.   albuterol (PROVENTIL HFA;VENTOLIN HFA) 108 (90 Base) MCG/ACT inhaler Inhale 2 puffs into the lungs every 6 (six) hours as needed for wheezing or shortness of breath. (Patient not taking: Reported on 07/12/2018)   alendronate (FOSAMAX) 70 MG tablet TK 1 T PO ONCE A WK (Patient not taking: Reported on 09/01/2022)   ALPRAZolam (XANAX) 0.25 MG tablet Take by mouth. (Patient not taking: Reported on 09/01/2022)   No facility-administered encounter medications on file as of 09/01/2022.       Medical History: Past Medical History:  Diagnosis Date   Arthritis    feet, hands   COPD (chronic obstructive pulmonary disease) (HCC)    Hepatitis C    treated with Harvoni 2019     Vital Signs: BP 130/82 (BP Location: Left Arm, Patient Position: Sitting, Cuff Size: Normal)   Pulse (!) 109   Temp 98.7 F (37.1 C) (Tympanic)   Ht 5\' 9"  (1.753 m)   Wt 184 lb 9.6 oz (83.7 kg)   SpO2 97%   BMI 27.26 kg/m    Review of Systems  Constitutional:  Positive for chills and fever.  HENT:  Positive for congestion and postnasal drip. Negative for sinus pressure, sinus pain and trouble swallowing.   Respiratory:  Positive for cough. Negative for chest tightness, shortness of breath and wheezing.   Cardiovascular: Negative.     Physical Exam Constitutional:      Appearance: Normal appearance.  HENT:     Head: Atraumatic.     Right Ear: Tympanic membrane, ear canal and external ear normal.     Left Ear: Tympanic membrane, ear canal and external ear normal.     Nose: Congestion present.     Mouth/Throat:     Mouth: Mucous membranes are moist.     Pharynx: Oropharynx is clear.  Eyes:     Extraocular Movements: Extraocular movements intact.  Cardiovascular:     Rate and Rhythm: Regular rhythm. Tachycardia present.  Pulmonary:     Effort: Pulmonary effort is normal.  Breath sounds: Normal breath sounds.  Musculoskeletal:     Cervical back: Neck supple.  Skin:    General: Skin is warm.  Neurological:     Mental Status: She is alert.  Psychiatric:        Mood and Affect: Mood normal.        Behavior: Behavior normal.        Thought Content: Thought content normal.        Judgment: Judgment normal.       Assessment/Plan:   ICD-10-CM   1. COVID-19 virus infection  U07.1     2. Nasal congestion  R09.81 POC COVID-19       General Counseling: [name] verbalizes understanding of the findings of todays visit and agrees with plan of treatment. I have discussed  any further diagnostic evaluation that may be needed or ordered today. We also reviewed her medications today. she has been encouraged to call the office with any questions or concerns that should arise related to todays visit.   Orders Placed This Encounter  Procedures   POC COVID-19   Reviewed positive Covid test result. Reviewed CDC guidelines with patient. Pt will be isolated for 5 days from the start of symptoms and use a mask for an additional 5 days. Must be fever free for 24 hours prior to returning to campus. Pt verbalized understanding and in agreement.  Stay well hydrated. Take otc Ibuprofen or Tylenol for fever and congestion. Continue to watch for worsening symptoms. Return to clinic for not feeling better or if symptoms worsen. Pt verbalized understanding and in agreement.  Today's visit is a 30 minute F2F encounter.   Time spent:30 Minutes    Gilberto Better, [place] Physician Assistant

## 2023-07-20 ENCOUNTER — Encounter: Payer: Self-pay | Admitting: Medical

## 2023-07-20 ENCOUNTER — Ambulatory Visit (INDEPENDENT_AMBULATORY_CARE_PROVIDER_SITE_OTHER): Payer: Self-pay | Admitting: Medical

## 2023-07-20 VITALS — BP 110/90 | HR 70 | Temp 98.3°F | Ht 69.0 in | Wt 194.0 lb

## 2023-07-20 DIAGNOSIS — S39012A Strain of muscle, fascia and tendon of lower back, initial encounter: Secondary | ICD-10-CM

## 2023-07-20 MED ORDER — CYCLOBENZAPRINE HCL 5 MG PO TABS
ORAL_TABLET | ORAL | 0 refills | Status: AC
Start: 2023-07-20 — End: ?

## 2023-07-20 NOTE — Patient Instructions (Addendum)
-  Try alternating doses of Ibuprofen and extra strength Tylenol every 4-6 hours as needed for pain. -You may take Cyclobenzaprine (Flexeril) at bedtime as needed for pain/muscle spasm. -Apply a heating pad to your low back for 15-20 minutes several times/day as needed for pain. -Consider trying Salonpas pain relieving patches. -Walk/move around as much as possible during the day to avoid stiffness.

## 2023-07-20 NOTE — Progress Notes (Signed)
[facility] [address] [place], [place] [address]   Office Visit Note  Patient Name: Crystal West Date of Birth [id number]  Medical Record number [id number]  Date of Service: 07/20/2023  Chief Complaint  Patient presents with   Acute Visit    Patient c/o lower left sided back pain which began after carrying groceries into her home this past Friday. She states she felt a "pulling". The pain will move around her side from her back to to her abdomen.     HPI 67 YO female present for back injury. Pain began 3 days ago while picking up bags of groceries. Recalls feeling a pulling sensation to left low back at the time. Since then, has mild throbbing pain (6/10) at rest, pain becomes sharp and increases in severity (8/10) with bending or movement at low back. Thinks pain has improved slightly in last 2-3 days with rest. Taking 600 mg Ibuprofen about every 4-5 hours, does help but does not fully relieve pain. No other treatment attempted. Pain radiates to left side/abdomen when bad. Has been able to shower and do normal ADLs, though some painful to wipe after using toilet and towel off after shower. No numbness or tingling in lower extremities. Was briefly more difficult to bear weight on left leg on day of injury, but this has since returned to normal. Climbing stairs initially difficult due to pain, has gotten easier. No difficulty controlling bowel or bladder. Denies any falls. Waking up more often at night to change position.  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 Often has low back pain. 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 Current injury was not work-related per patient. Has used Flexeril in past with some benefit.Marland Kitchen  Hx of osteopenia in spine and hip. Not taking Fosamax currently. Most recent bone density in 2021 was improved from prior test.    Current Medication:  Outpatient Encounter Medications as of 07/20/2023  Medication Sig    furosemide (LASIX) 20 MG tablet Take by mouth daily as needed.   TRELEGY ELLIPTA 100-62.5-25 MCG/ACT AEPB Inhale 1 puff into the lungs daily.   albuterol (PROVENTIL HFA;VENTOLIN HFA) 108 (90 Base) MCG/ACT inhaler Inhale 2 puffs into the lungs every 6 (six) hours as needed for wheezing or shortness of breath. (Patient not taking: Reported on 07/20/2023)   alendronate (FOSAMAX) 70 MG tablet TK 1 T PO ONCE A WK (Patient not taking: Reported on 09/01/2022)   ALPRAZolam (XANAX) 0.25 MG tablet Take by mouth. (Patient not taking: Reported on 09/01/2022)   buprenorphine-naloxone (SUBOXONE) 2-0.5 mg SUBL SL tablet Place 0.5 tablets under the tongue 2 (two) times daily.  (Patient not taking: Reported on 07/20/2023)   No facility-administered encounter medications on file as of 07/20/2023.      Medical History: Past Medical History:  Diagnosis Date   Arthritis    feet, hands   COPD (chronic obstructive pulmonary disease) (HCC)    Hepatitis C    treated with Harvoni 2019  Osteopenia   Vital Signs: BP (!) 110/90 (BP Location: Right Arm, Patient Position: Sitting, Cuff Size: Normal)   Pulse 70   Temp 98.3 F (36.8 C)   Ht 5\' 9"  (1.753 m)   Wt 194 lb (88 kg)   SpO2 96%   BMI 28.65 kg/m    Review of Systems  Constitutional:  Positive for activity change (decreased).  Musculoskeletal:  Positive for back pain and gait problem (slower due to pain). Negative for  neck pain and neck stiffness.  Neurological:  Negative for weakness and numbness.    Physical Exam Vitals reviewed.  Constitutional:      General: She is not in acute distress.    Appearance: She is not ill-appearing.     Comments: Moving slowly to stand and walk  Musculoskeletal:     Cervical back: No swelling, deformity or signs of trauma.     Thoracic back: No swelling, deformity or signs of trauma.     Lumbar back: No swelling, deformity, signs of trauma, spasms, tenderness or bony tenderness. Decreased range of motion (Limited  forward and left lateral flexion, extenstion and lateral rotation to left due to pain).     Right lower leg: No edema.     Left lower leg: No edema.  Neurological:     Mental Status: She is alert.     Deep Tendon Reflexes:     Reflex Scores:      Patellar reflexes are 2+ on the right side and 2+ on the left side.    Comments: Gait slower due to back pain, otherwise intact. Sensation (light touch) to lower extremities intact and symmetric. Muscle strength mildly decreased to left leg (hip flexion and knee extension) compared to right (patient attributes to pain).     Assessment/Plan: 1. Strain of lumbar region, initial encounter History and exam consistent with low back strain. No concern for fracture or significant spinal pathology at this time. Xray unlikely to be helpful. Recommended alternating Ibuprofen with Tylenol every 4-6 hours as needed for pain. Will try Cyclobenzaprine at bedtime for pain, cautioned not to drive or operate machinery after taking. Discussed using heating pad for 15-20 minutes several times/day. Discussed trial of Salonpas patches. Encouraged to move as much as possible throughout day to avoid stiffness. Avoid lifting. Gave work excuse until 07/24/23. Advised to follow up in clinic 07/23/23 to re-evaluate  - cyclobenzaprine (FLEXERIL) 5 MG tablet; Take one or two tablets at bedtime as needed for back pain or muscle spasm.  Dispense: 30 tablet; Refill: 0    General Counseling: Burnett verbalizes understanding of the findings of todays visit and agrees with plan of treatment. We reviewed her medications today. she has been encouraged to call the office with any questions or concerns that should arise related to todays visit.   Time spent:20Minutes    Jonathon Resides PA-C Physician Assistant

## 2023-07-23 ENCOUNTER — Ambulatory Visit (INDEPENDENT_AMBULATORY_CARE_PROVIDER_SITE_OTHER): Payer: Self-pay | Admitting: Adult Health

## 2023-07-23 ENCOUNTER — Encounter: Payer: Self-pay | Admitting: Adult Health

## 2023-07-23 VITALS — BP 116/90 | HR 77 | Ht 69.0 in | Wt 194.0 lb

## 2023-07-23 DIAGNOSIS — M545 Low back pain, unspecified: Secondary | ICD-10-CM

## 2023-07-23 MED ORDER — PREDNISONE 10 MG PO TABS: ORAL_TABLET | ORAL | 0 refills | Status: AC

## 2023-07-23 NOTE — Progress Notes (Signed)
[facility] [address] [place], [place] [address]   Office Visit Note  Patient Name: Crystal West Date of Birth [id number]  Medical Record number [id number]  Date of Service: 07/23/2023  Chief Complaint  Patient presents with   Follow-up    F/U; L-sided lower back pain     HPI Pt is here for follow up on back pain.  See previous OV note.  She is not 6 days into pain.  She has been taking Flexeril, and was able to sleep and rest.  Her son had a mild stroke yesterday, and she had to sit at the hospital all day yesterday with him, so her back is worse today.  She denies any numbness or tingling.  She describes a throbbing pain just to the left side of her spine on her lower back.     Current Medication:  Outpatient Encounter Medications as of 07/23/2023  Medication Sig   albuterol (PROVENTIL HFA;VENTOLIN HFA) 108 (90 Base) MCG/ACT inhaler Inhale 2 puffs into the lungs every 6 (six) hours as needed for wheezing or shortness of breath.   cyclobenzaprine (FLEXERIL) 5 MG tablet Take one or two tablets at bedtime as needed for back pain or muscle spasm.   furosemide (LASIX) 20 MG tablet Take by mouth daily as needed.   predniSONE (DELTASONE) 10 MG tablet Take 6 tablets (60 mg total) by mouth daily with breakfast for 1 day, THEN 5 tablets (50 mg total) daily with breakfast for 1 day, THEN 4 tablets (40 mg total) daily with breakfast for 1 day, THEN 3 tablets (30 mg total) daily with breakfast for 1 day, THEN 2 tablets (20 mg total) daily with breakfast for 1 day, THEN 1 tablet (10 mg total) daily with breakfast for 1 day.   TRELEGY ELLIPTA 100-62.5-25 MCG/ACT AEPB Inhale 1 puff into the lungs daily.   [DISCONTINUED] alendronate (FOSAMAX) 70 MG tablet TK 1 T PO ONCE A WK (Patient not taking: Reported on 09/01/2022)   [DISCONTINUED] ALPRAZolam (XANAX) 0.25 MG tablet Take by mouth. (Patient not taking: Reported on 09/01/2022)   [DISCONTINUED] buprenorphine-naloxone (SUBOXONE) 2-0.5 mg SUBL  SL tablet Place 0.5 tablets under the tongue 2 (two) times daily.  (Patient not taking: Reported on 07/20/2023)   No facility-administered encounter medications on file as of 07/23/2023.      Medical History: Past Medical History:  Diagnosis Date   Arthritis    feet, hands   COPD (chronic obstructive pulmonary disease) (HCC)    Hepatitis C    treated with Harvoni 2019   Osteopenia      Vital Signs: BP (!) 116/90 (BP Location: Left Arm, Patient Position: Sitting, Cuff Size: Normal)   Pulse 77   Ht 5\' 9"  (1.753 m)   Wt 194 lb (88 kg)   SpO2 95%   BMI 28.65 kg/m    Review of Systems  Constitutional:  Negative for chills and fever.  Musculoskeletal:  Positive for back pain. Negative for gait problem.    Physical Exam Vitals reviewed.  Constitutional:      Appearance: Normal appearance.  Musculoskeletal:     Comments: Back pain with palpation of lumbar spine.  Neurological:     General: No focal deficit present.     Mental Status: She is alert and oriented to person, place, and time.    Assessment/Plan: 1. Acute left-sided low back pain without sciatica Continue to use heat and flexeril as previously recommended.  Take prednisone as prescribed.  -  predniSONE (DELTASONE) 10 MG tablet; Take 6 tablets (60 mg total) by mouth daily with breakfast for 1 day, THEN 5 tablets (50 mg total) daily with breakfast for 1 day, THEN 4 tablets (40 mg total) daily with breakfast for 1 day, THEN 3 tablets (30 mg total) daily with breakfast for 1 day, THEN 2 tablets (20 mg total) daily with breakfast for 1 day, THEN 1 tablet (10 mg total) daily with breakfast for 1 day.  Dispense: 21 tablet; Refill: 0     General Counseling: Crystal West verbalizes understanding of the findings of todays visit and agrees with plan of treatment. I have discussed any further diagnostic evaluation that may be needed or ordered today. We also reviewed her medications today. she has been encouraged to call the office  with any questions or concerns that should arise related to todays visit.   No orders of the defined types were placed in this encounter.   Meds ordered this encounter  Medications   predniSONE (DELTASONE) 10 MG tablet    Sig: Take 6 tablets (60 mg total) by mouth daily with breakfast for 1 day, THEN 5 tablets (50 mg total) daily with breakfast for 1 day, THEN 4 tablets (40 mg total) daily with breakfast for 1 day, THEN 3 tablets (30 mg total) daily with breakfast for 1 day, THEN 2 tablets (20 mg total) daily with breakfast for 1 day, THEN 1 tablet (10 mg total) daily with breakfast for 1 day.    Dispense:  21 tablet    Refill:  0    Time spent:20 Minutes    Johnna Acosta AGNP-C Nurse Practitioner

## 2024-04-14 ENCOUNTER — Ambulatory Visit: Payer: Self-pay

## 2024-04-14 DIAGNOSIS — K640 First degree hemorrhoids: Secondary | ICD-10-CM | POA: Diagnosis not present

## 2024-04-14 DIAGNOSIS — D120 Benign neoplasm of cecum: Secondary | ICD-10-CM | POA: Diagnosis not present

## 2024-04-14 DIAGNOSIS — K5730 Diverticulosis of large intestine without perforation or abscess without bleeding: Secondary | ICD-10-CM | POA: Diagnosis not present

## 2024-04-14 DIAGNOSIS — K635 Polyp of colon: Secondary | ICD-10-CM | POA: Diagnosis not present

## 2024-04-14 DIAGNOSIS — Z1211 Encounter for screening for malignant neoplasm of colon: Secondary | ICD-10-CM | POA: Diagnosis present

## 2024-09-30 ENCOUNTER — Ambulatory Visit: Payer: Self-pay | Admitting: Physician Assistant

## 2024-09-30 ENCOUNTER — Other Ambulatory Visit: Payer: Self-pay

## 2024-09-30 ENCOUNTER — Encounter: Payer: Self-pay | Admitting: Physician Assistant

## 2024-09-30 VITALS — BP 140/90 | HR 72 | Temp 97.2°F | Ht 69.0 in | Wt 193.0 lb

## 2024-09-30 DIAGNOSIS — M25551 Pain in right hip: Secondary | ICD-10-CM

## 2024-09-30 MED ORDER — MELOXICAM 15 MG PO TABS: 15.0000 mg | ORAL_TABLET | Freq: Every day | ORAL | 1 refills | Status: AC

## 2024-09-30 NOTE — Progress Notes (Signed)
 [facility] [address] [place], [place] [address]   Office Visit Note  Patient Name: Crystal West Date of Birth [id number]  Medical Record number [id number]  Date of Service: 09/30/2024  Chief Complaint  Patient presents with   Hip Pain    Patient reports R hip pain x 6-8 weeks. She also reports being diagnosed with arthritis in her lower back last year. She used Voltaren gel on her hip which seemed helpful at first but doe snot seem to be working any longer.      HPI Pt is here for a visit. C/o atraumatic throbbing R hip pain x5 wks. Wasn't as bad when it started, more dull, but gradually has gotten more constant and painful. Wakes up with mild pain, but by lunch time the pain is much worse. Activity worsens it, does a lot of walking. Squatting is impossible now. Hurts to lay on that side.  Pain is right over trochanteric bursa area. Doesn't radiate.  Using voltaren gel which was initially effective but has been less effective recently. Takes Advil which helps a little.  Says she doesn't have PCP d/t the provider moving, has been putting off her symptoms for a while but decided today to get checked out.  Of note, hx of low back pain last year, told she has arthritis in her back.  No fevers/chills, CP, SOB, abd pain, n/v/d/c, urinary symptoms, numbness/tingling, focal weakness, or any other complaints.    ROS: Review of Systems  Constitutional:  Negative for chills and fever.  Respiratory:  Negative for shortness of breath.   Cardiovascular:  Negative for chest pain.  Gastrointestinal:  Negative for abdominal pain, constipation, diarrhea, nausea and vomiting.  Genitourinary:  Negative for dysuria, flank pain and hematuria.  Musculoskeletal:  Positive for arthralgias. Negative for myalgias.  Skin:  Negative for color change.  Allergic/Immunologic: Negative for immunocompromised state.  Neurological:  Negative for weakness and numbness.     Current  Medication:  Outpatient Encounter Medications as of 09/30/2024  Medication Sig   furosemide (LASIX) 20 MG tablet Take by mouth daily as needed.   meloxicam (MOBIC) 15 MG tablet Take 1 tablet (15 mg total) by mouth daily.   TRELEGY ELLIPTA 100-62.5-25 MCG/ACT AEPB Inhale 1 puff into the lungs daily.   albuterol  (PROVENTIL  HFA;VENTOLIN  HFA) 108 (90 Base) MCG/ACT inhaler Inhale 2 puffs into the lungs every 6 (six) hours as needed for wheezing or shortness of breath.   cyclobenzaprine  (FLEXERIL ) 5 MG tablet Take one or two tablets at bedtime as needed for back pain or muscle spasm.   No facility-administered encounter medications on file as of 09/30/2024.      Medical History: Past Medical History:  Diagnosis Date   Arthritis    feet, hands   COPD (chronic obstructive pulmonary disease) (HCC)    Hepatitis C    treated with Harvoni 2019   Osteopenia      Vital Signs: BP (!) 140/90   Pulse 72   Temp (!) 97.2 F (36.2 C)   Ht 5' 9 (1.753 m)   Wt 87.5 kg   SpO2 96%   BMI 28.50 kg/m    Physical Exam Vitals and nursing note reviewed.  Constitutional:      General: She is not in acute distress.    Appearance: Normal appearance. She is well-developed. She is not toxic-appearing.     Comments: Afebrile, nontoxic, NAD  HENT:     Head: Normocephalic and atraumatic.  Mouth/Throat:     Mouth: Mucous membranes are moist.  Eyes:     General:        Right eye: No discharge.        Left eye: No discharge.     Conjunctiva/sclera: Conjunctivae normal.  Cardiovascular:     Rate and Rhythm: Normal rate.     Pulses: Normal pulses.  Pulmonary:     Effort: Pulmonary effort is normal. No respiratory distress.  Abdominal:     General: There is no distension.  Musculoskeletal:     Cervical back: Normal range of motion and neck supple.     Right hip: Tenderness and bony tenderness present. No crepitus. Decreased range of motion.     Comments: R hip: ROM limited with IR d/t pain,  full ROM otherwise. +FADIR and +FABER testing elicits pain, moreso with IR of hip, tenderness near trochanteric bursa and slightly superior into the hip joint area and TFL/glute med. No other areas of tenderness to remainder of hip/lumbar area. Strength/sensation grossly intact  Skin:    General: Skin is warm and dry.     Findings: No rash.  Neurological:     Mental Status: She is alert and oriented to person, place, and time.     Sensory: Sensation is intact. No sensory deficit.     Motor: Motor function is intact.  Psychiatric:        Mood and Affect: Mood and affect normal.        Behavior: Behavior normal.       Assessment/Plan: 1. Right hip pain (Primary) - AMB referral to orthopedics   Pt here with R hip pain gradually worsening x5wks. Pain mostly in joint, somewhat in trochanteric bursa area. +pain with IR mostly, somewhat limited ROM with that d/t pain. Some TTP to [place] as well.  Likely arthritic changes, could also be labrum pathology but less likely given age and hx of known arthritis in other areas.  Advised f/up with ortho would be the best next step, allowing them to get initial xray imaging and continue the work up/management from there.  Will refer to [facility].  Rx given for mobic, advised NO OTHER NSAIDs (ibuprofen, aleve, etc). CAN TAKE additional tylenol  for relief.  Use heat packs/compresses.  Can use OTC Lidocaine  patches for additional relief.  Ok to use Voltaren judiciously.  F/up with ortho.  Strict ED/return precautions discussed.    General Counseling: burnice vassel understanding of the findings of todays visit and agrees with plan of treatment. I have discussed any further diagnostic evaluation that may be needed or ordered today. We also reviewed her medications today. she has been encouraged to call the office with any questions or concerns that should arise related to todays visit.   Orders Placed This Encounter  Procedures   AMB referral to  orthopedics   No results found for this or any previous visit (from the past 24 hours).   Meds ordered this encounter  Medications   meloxicam (MOBIC) 15 MG tablet    Sig: Take 1 tablet (15 mg total) by mouth daily.    Dispense:  30 tablet    Refill:  1    Time spent: 5 Gartner Shakiera Edelson, Development worker, international aid

## 2024-12-09 ENCOUNTER — Ambulatory Visit: Payer: Self-pay | Admitting: Adult Health

## 2024-12-09 ENCOUNTER — Encounter: Payer: Self-pay | Admitting: Adult Health

## 2024-12-09 ENCOUNTER — Other Ambulatory Visit: Payer: Self-pay

## 2024-12-09 VITALS — BP 150/100 | HR 72 | Temp 97.6°F | Ht 69.0 in | Wt 194.0 lb

## 2024-12-09 DIAGNOSIS — R42 Dizziness and giddiness: Secondary | ICD-10-CM

## 2024-12-09 MED ORDER — MECLIZINE HCL 25 MG PO TABS: ORAL_TABLET | ORAL | 0 refills | Status: AC

## 2024-12-09 NOTE — Progress Notes (Signed)
 [facility] [address] [place], [place] [address]   Office Visit Note  Patient Name: Crystal West Date of Birth [id number]  Medical Record number [id number]  Date of Service: 12/09/2024  Chief Complaint  Patient presents with   Dizziness    Patient began feeling dizzy yesterday when she would bend over. Symptoms have worsened since initial onset and she feels dizzy when walking or with any type of movement.     HPI Pt is here for a sick visit. Pt reports she woke up yesterday with room spinning. She was able to get her bearings, but any time she bent over she felt like she was going to be sick.  Denies any vomiting. Today she is consistently dizzy. She denies history of vertigo.  Denies chest pain, sob, or other complaints.    Current Medication:  Outpatient Encounter Medications as of 12/09/2024  Medication Sig   furosemide (LASIX) 20 MG tablet Take by mouth daily as needed.   meclizine (ANTIVERT) 25 MG tablet Take 1/2 or whole tablet up to 3 times daily as needed for dizziness.   meloxicam  (MOBIC ) 15 MG tablet Take 1 tablet (15 mg total) by mouth daily.   TRELEGY ELLIPTA 100-62.5-25 MCG/ACT AEPB Inhale 1 puff into the lungs daily.   albuterol  (PROVENTIL  HFA;VENTOLIN  HFA) 108 (90 Base) MCG/ACT inhaler Inhale 2 puffs into the lungs every 6 (six) hours as needed for wheezing or shortness of breath.   [DISCONTINUED] cyclobenzaprine  (FLEXERIL ) 5 MG tablet Take one or two tablets at bedtime as needed for back pain or muscle spasm.   No facility-administered encounter medications on file as of 12/09/2024.      Medical History: Past Medical History:  Diagnosis Date   Arthritis    feet, hands   COPD (chronic obstructive pulmonary disease) (HCC)    Hepatitis C    treated with Harvoni 2019   Osteopenia      Vital Signs: BP (!) 150/100   Pulse 72   Temp 97.6 F (36.4 C)   Ht 5' 9 (1.753 m)   Wt 194 lb (88 kg)   SpO2 98%   BMI 28.65 kg/m    Review of  Systems  Constitutional:  Negative for chills, fatigue and fever.  Neurological:  Positive for dizziness and light-headedness. Negative for tremors, seizures, syncope, speech difficulty, weakness and numbness.    Physical Exam Vitals reviewed.  Constitutional:      Appearance: Normal appearance.  Neurological:     Mental Status: She is alert.     Assessment/Plan: 1. Vertigo (Primary) Uuse meclizine as discussed. Follow up via MyChart messenger if symptoms fail to improve or may return to clinic as needed for worsening symptoms.   - meclizine (ANTIVERT) 25 MG tablet; Take 1/2 or whole tablet up to 3 times daily as needed for dizziness.  Dispense: 30 tablet; Refill: 0     General Counseling: Crystal West verbalizes understanding of the findings of todays visit and agrees with plan of treatment. I have discussed any further diagnostic evaluation that may be needed or ordered today. We also reviewed her medications today. she has been encouraged to call the office with any questions or concerns that should arise related to todays visit.   No orders of the defined types were placed in this encounter.   Meds ordered this encounter  Medications   meclizine (ANTIVERT) 25 MG tablet    Sig: Take 1/2 or whole tablet up to 3 times daily as needed for  dizziness.    Dispense:  30 tablet    Refill:  0    Time spent:15 Minutes    Crystal West AGNP-C Nurse Practitioner

## 2024-12-12 ENCOUNTER — Other Ambulatory Visit: Payer: Self-pay

## 2024-12-12 ENCOUNTER — Telehealth: Payer: Self-pay | Admitting: Adult Health

## 2024-12-12 DIAGNOSIS — R42 Dizziness and giddiness: Secondary | ICD-10-CM

## 2024-12-12 NOTE — Telephone Encounter (Signed)
 Patient states vertigo is not getting better. She has called requesting we refer her to [facility] please.  Sending to [facility]

## 2024-12-21 ENCOUNTER — Other Ambulatory Visit: Payer: Self-pay | Admitting: Internal Medicine

## 2024-12-21 DIAGNOSIS — Z1231 Encounter for screening mammogram for malignant neoplasm of breast: Secondary | ICD-10-CM

## 2024-12-23 ENCOUNTER — Inpatient Hospital Stay
Admission: RE | Admit: 2024-12-23 | Discharge: 2024-12-23 | Disposition: A | Discharge status: Home / Self Care | Payer: Self-pay | Source: Ambulatory Visit | Attending: Internal Medicine | Admitting: Internal Medicine

## 2024-12-23 ENCOUNTER — Other Ambulatory Visit: Payer: Self-pay | Admitting: *Deleted

## 2024-12-23 DIAGNOSIS — Z1231 Encounter for screening mammogram for malignant neoplasm of breast: Secondary | ICD-10-CM

## 2025-01-09 ENCOUNTER — Telehealth: Payer: Self-pay

## 2025-01-09 NOTE — Telephone Encounter (Signed)
 Per fax received from [facility], patient has been scheduled to see Dr. Milissa on 01/16/25 at 9:30 AM. Spoke with patient and she is aware of appt date/time.

## 2025-01-17 ENCOUNTER — Ambulatory Visit
Admission: RE | Admit: 2025-01-17 | Discharge: 2025-01-17 | Disposition: A | Discharge status: Home / Self Care | Payer: Self-pay | Source: Ambulatory Visit | Attending: Internal Medicine | Admitting: Internal Medicine

## 2025-01-17 DIAGNOSIS — Z1231 Encounter for screening mammogram for malignant neoplasm of breast: Secondary | ICD-10-CM | POA: Diagnosis present
